# Patient Record
Sex: Male | Born: 1970 | Race: White | Hispanic: No | Marital: Married | Smoking: Never smoker
Health system: Southern US, Community
[De-identification: ages and names within clinical notes are randomized; demographics above are authoritative.]

## PROBLEM LIST (undated history)

## (undated) DIAGNOSIS — C189 Malignant neoplasm of colon, unspecified: Secondary | ICD-10-CM

---

## 2000-01-29 ENCOUNTER — Emergency Department (HOSPITAL_COMMUNITY): Admission: EM | Admit: 2000-01-29 | Discharge: 2000-01-29 | Payer: Self-pay | Admitting: Emergency Medicine

## 2000-01-29 ENCOUNTER — Encounter: Payer: Self-pay | Admitting: Emergency Medicine

## 2002-11-17 ENCOUNTER — Emergency Department (HOSPITAL_COMMUNITY): Admission: EM | Admit: 2002-11-17 | Discharge: 2002-11-17 | Payer: Self-pay | Admitting: Emergency Medicine

## 2005-03-28 ENCOUNTER — Emergency Department (HOSPITAL_COMMUNITY): Admission: EM | Admit: 2005-03-28 | Discharge: 2005-03-28 | Payer: Self-pay | Admitting: Emergency Medicine

## 2006-12-28 ENCOUNTER — Emergency Department (HOSPITAL_COMMUNITY): Admission: EM | Admit: 2006-12-28 | Discharge: 2006-12-28 | Payer: Self-pay | Admitting: Emergency Medicine

## 2007-02-28 ENCOUNTER — Emergency Department (HOSPITAL_COMMUNITY): Admission: EM | Admit: 2007-02-28 | Discharge: 2007-02-28 | Payer: Self-pay | Admitting: Emergency Medicine

## 2011-03-28 LAB — URINALYSIS, ROUTINE W REFLEX MICROSCOPIC
Bilirubin Urine: NEGATIVE
Glucose, UA: NEGATIVE
Hgb urine dipstick: NEGATIVE
Ketones, ur: NEGATIVE
Nitrite: NEGATIVE
Protein, ur: NEGATIVE
Specific Gravity, Urine: 1.008
Urobilinogen, UA: 0.2
pH: 6

## 2015-11-07 DIAGNOSIS — E781 Pure hyperglyceridemia: Secondary | ICD-10-CM | POA: Insufficient documentation

## 2015-11-07 DIAGNOSIS — E6609 Other obesity due to excess calories: Secondary | ICD-10-CM | POA: Insufficient documentation

## 2015-11-07 DIAGNOSIS — F325 Major depressive disorder, single episode, in full remission: Secondary | ICD-10-CM | POA: Insufficient documentation

## 2016-11-14 DIAGNOSIS — Z789 Other specified health status: Secondary | ICD-10-CM | POA: Insufficient documentation

## 2019-08-01 DIAGNOSIS — C787 Secondary malignant neoplasm of liver and intrahepatic bile duct: Secondary | ICD-10-CM | POA: Insufficient documentation

## 2021-02-19 ENCOUNTER — Encounter: Payer: Self-pay | Admitting: *Deleted

## 2021-02-20 ENCOUNTER — Ambulatory Visit
Admission: RE | Admit: 2021-02-20 | Discharge: 2021-02-20 | Disposition: A | Discharge status: Home / Self Care | Payer: Self-pay | Source: Ambulatory Visit | Attending: Oncology | Admitting: Oncology

## 2021-02-20 ENCOUNTER — Telehealth: Payer: Self-pay | Admitting: Medical Oncology

## 2021-02-20 ENCOUNTER — Other Ambulatory Visit: Payer: Self-pay | Admitting: *Deleted

## 2021-02-20 ENCOUNTER — Encounter: Payer: Self-pay | Admitting: *Deleted

## 2021-02-20 DIAGNOSIS — Z9189 Other specified personal risk factors, not elsewhere classified: Secondary | ICD-10-CM

## 2021-02-20 NOTE — Telephone Encounter (Signed)
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 Benay Spice tomorrow and is interested in the [facility] study. Call to patient to inquire about emailing him the study consent form for information purposes so that he can review and discuss with spouse and if he has any questions, can discuss with Dr. Benay Spice at his appointment. Patient stated he would like that and provided me with his email address. I also inquired with patient if he would be available prior to MD appointment or after MD appointment to review the consent with a research nurse, should he be interested, and sign ICF. Patient stated he would be available to come in early or stay after. Patient was informed that someone from research will meet with him to review consent tomorrow. Patient denied further questions at this time. I thanked patient for his time and interest in study and encouraged him to contact Dr. Benay Spice. Patient was informed that I will be out of the office through the end of the week and will return on Monday the 12th, patient provided with Doreatha Martin, System-wide manager, contact information should he have any research related questions.  Patient was emailed study consent form Global V2 [id number], [place] Main ICF V2.0 [phone] for his review.  [name], RN, BSN, [facility] Clinical Research 02/20/2021 2:29 PM

## 2021-02-21 ENCOUNTER — Inpatient Hospital Stay: Payer: Managed Care, Other (non HMO) | Attending: Oncology | Admitting: Oncology

## 2021-02-21 ENCOUNTER — Other Ambulatory Visit: Payer: Self-pay | Admitting: *Deleted

## 2021-02-21 ENCOUNTER — Inpatient Hospital Stay: Payer: Managed Care, Other (non HMO) | Admitting: Emergency Medicine

## 2021-02-21 ENCOUNTER — Other Ambulatory Visit: Payer: Self-pay

## 2021-02-21 ENCOUNTER — Telehealth: Payer: Self-pay | Admitting: Emergency Medicine

## 2021-02-21 VITALS — BP 136/90 | HR 84 | Temp 97.8°F | Resp 20 | Ht 70.0 in | Wt 226.6 lb

## 2021-02-21 DIAGNOSIS — Z9189 Other specified personal risk factors, not elsewhere classified: Secondary | ICD-10-CM

## 2021-02-21 DIAGNOSIS — C187 Malignant neoplasm of sigmoid colon: Secondary | ICD-10-CM | POA: Diagnosis not present

## 2021-02-21 DIAGNOSIS — Z006 Encounter for examination for normal comparison and control in clinical research program: Secondary | ICD-10-CM | POA: Insufficient documentation

## 2021-02-21 DIAGNOSIS — C787 Secondary malignant neoplasm of liver and intrahepatic bile duct: Secondary | ICD-10-CM | POA: Insufficient documentation

## 2021-02-21 DIAGNOSIS — E119 Type 2 diabetes mellitus without complications: Secondary | ICD-10-CM | POA: Diagnosis not present

## 2021-02-21 DIAGNOSIS — Z86711 Personal history of pulmonary embolism: Secondary | ICD-10-CM | POA: Diagnosis not present

## 2021-02-21 NOTE — Progress Notes (Unsigned)
ou

## 2021-02-21 NOTE — Telephone Encounter (Signed)
[place] / "A Phase 3 Multicenter, Randomized, Openlabel, Active-controlled Study of Sotorasib and Panitumumab Versus Investigator's Choice (Trifluridine and Tipiracil, or Regorafenib) for the Treatment of Previously Treated Metastatic Colorectal Cancer Subjects with KRAS p.G12C Mutation."  Called pt to let him know I will speak to him regarding study after his appt with MD Benay Spice today at 1:40pm and to call back before then with any questions/concerns.  No answer, left VM with this info.  Provided name and call back number.  Wells Guiles 'Learta CoddingNeysa Bonito, RN, BSN Clinical Research Nurse I 02/21/21 10:58 AM

## 2021-02-21 NOTE — Research (Addendum)
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  This Clinical Research Nurse met with Dan Bell, [id number], on 02/21/21 in a manner and location that ensures patient privacy to discuss participation in the above listed research study.  Patient is Accompanied by spouse today .  A copy of the informed consent document with embedded HIPAA language was provided to the patient.  Patient reads, speaks, and understands [place].    Patient was provided with the business card of this Nurse and encouraged to contact the research team with any questions.  Patient was provided the option of taking informed consent documents home to review and was encouraged to review at their convenience with their support network, including other care providers. Patient is comfortable with making a decision regarding study participation today.  As outlined in the informed consent form, this Nurse and Ballard Russell discussed the purpose of the research study, the investigational nature of the study, study procedures and requirements for study participation, potential risks and benefits of study participation, as well as alternatives to participation. This study is not blinded. The patient understands participation is voluntary and they may withdraw from study participation at any time.  Each study arm was reviewed, and randomization discussed.  Potential side effects were reviewed with patient as outlined in the consent form, and patient made aware there may be side effects not yet known. This study does not involve  a placebo. Patient understands enrollment is pending full eligibility review.   Confidentiality and how the patient's information will be used as part of study participation were discussed.  Patient was informed there is reimbursement provided for their time and effort spent on trial participation.  The patient is encouraged to discuss research study participation with their insurance provider to determine what costs they may incur as part of study participation, including research related injury.    All questions were answered to patient's satisfaction.  The informed consent with embedded HIPAA language was reviewed page by page.  The patient's mental and emotional status is appropriate to provide informed consent, and the patient verbalizes an understanding of study participation.  Patient has agreed to participate in the above listed research study and has voluntarily signed the informed consent version 2.0 ([place] Main ICF) with embedded HIPAA language, version 2.0 ([place] Main ICF)  on 02/21/21 at 3:14 PM.  The patient was provided with a copy of the signed informed consent form with embedded HIPAA language for their reference.  No study specific procedures were obtained prior to the signing of the informed consent document.  Approximately 60 minutes were spent with the patient reviewing the informed consent documents.  Patient was not requested to complete a Release of Information form.  Will f/u with patient in next few days for scheduling of needed appts and assessments, pt aware to expect call.  Wells Guiles '[place], RN, BSN Clinical Research Nurse I 02/21/21 3:31 PM   LATE ENTRY/ADDENDUM  Second page of study consent requested 'Participant ID' which was left blank as patient has not been enrolled yet and hasn't been assigned a study ID.  Patient reports having a vasectomy and his wife having an IUD, both verbalizing  understanding regarding study restrictions on pregnancy.  Patient was seen  today per referral from [facility] (Dr. Harlow Asa) by MD Benay Spice as part of standard of care, not as part of study procedures.  Patient signed consent form after visit with MD Benay Spice.  Wells Guiles 'Learta CoddingNeysa Bonito, RN, BSN Clinical Research Nurse I 02/21/21 4:16 PM

## 2021-02-21 NOTE — Progress Notes (Addendum)
[place] New Patient Consult   Requesting MD: Christian Mate., Md [address] [place],  [place] [address]   Dan Bell 50 y.o.  Apr 29, 1971    Reason for Consult: Colon cancer   HPI: Mr. Going developed intermittent nausea/vomiting and weight loss in the summer 2020.  He reports losing 40 pounds. He was referred to Dr. Rolm Bookbinder and was taken to an GD and colonoscopy on 07/27/2019.  There was evidence of Barrett's esophagus on the EGD.  The colonoscopy revealed an infiltrative sick centimeter mass in the distal sigmoid colon.  The mass caused partial obstruction.  The pathology from the sigmoid colon biopsy revealed well differentiated adenocarcinoma. He was referred for staging CTs on 07/28/2019.  The CTs revealed numerous liver metastases, a distal sigmoid mass, a necrotic lymph node in the sigmoid mesentery, and enlarged porta hepatis nodes.  The CEA was markedly elevated.  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  The tumor was positive for a K-ras G 12 C mutation.  MMR proficient.  He was treated with FOLFOX plus bevacizumab for a total of 10 cycles.  He had an infusion reaction to oxaliplatin with cycle 10.  He completed cycle 11 on 01/25/2020 without oxaliplatin.  He completed a total of 19 cycles and treatment was discontinued secondary to disease progression.  Treatment was changed to FOLFIRI plus bevacizumab beginning 06/20/2020.  He has completed 12 cycles. Restaging CTs on 02/11/2021 revealed more prominent juxta diaphragmatic lymph nodes and distal left paraesophageal nodes.  Right lower lobe and left lower lobe pulmonary nodules.,  Liver metastases are slightly increased in size.  There is evidence of portal hypertension with distal paraesophageal varices.  Splenomegaly.  He discussed treatment options with Dr. Laverle Hobby including standard treatment with Lonsurf or regorafenib.  He is referred to  consider enrollment on the [employer] sotorasib study.  Mr. Jurewicz feels well at present.  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  His maternal grandmother had melanoma.  His father had lung cancer and was a smoker  Social History:   He lives with his wife and daughter in [place].  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  He uses a vape apparatus, rare alcohol use.  No transfusion history.  No risk factor for HIV or hepatitis.  He has received COVID-19 vaccines  ROS:   Positives include: Weight loss prior to the diagnosis of colon cancer-resolved, nausea 1 to 2 days following chemotherapy, decreased visual acuity-wears readers  A complete ROS was otherwise negative.  Physical Exam:  Blood pressure 136/90, pulse 84, temperature 97.8 F (36.6 C), temperature source Oral, resp. rate 20, height '5\' 10"'  (1.778 m), weight 226 lb 9.6 oz (102.8 kg), SpO2 100 %.  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, mismatch repair proficient Colonoscopy 07/27/2019-sigmoid colon mass, biopsy-well differentiated adenocarcinoma CTs chest, abdomen, pelvis  07/28/2019-numerous liver metastases, mass in the distal sigmoid colon, necrotic appearing node in the sigmoid mesentery, enlarged porta hepatis and portacaval nodes Markedly elevated CEA 08/01/2019 Biopsy of liver lesion 08/23/2019-metastatic mucinous adenocarcinoma, CK20 and CDX2 positive FOLFOX plus bevacizumab, cycle 1 08/24/2019, 11 total cycles, oxaliplatin held with cycle 11 secondary to a reaction with cycle 10 FOLFOX plus bevacizumab discontinued after cycle 19 secondary to disease progression FOLFIRI plus bevacizumab beginning 06/20/2020, status post 12 cycles Disease progression on CT 02/11/2021-enlarging lung nodules and liver lesions CEA increased 02/13/2021 Type 2 diabetes Left lower extremity DVT 2018 Oxaliplatin reaction with cycle 10    Disposition:   Mr. Kiernan has a history of metastatic colon cancer diagnosed in February 2021.  He has been treated with FOLFOX/bevacizumab and FOLFIRI/bevacizumab.  Restaging CTs last week confirmed disease progression.  The CEA was more elevated on 02/13/2021.  Dr. Harlow Asa recommends discontinuing FOLFIRI/bevacizumab.  He discussed treatment options with Mr. Medford including standard salvage treatment with regorafenib or Lonsurf.  He also discussed the [employer] sotorasib clinical trial.  Mr. Wilmes was referred to consider enrollment on the clinical trial.  I discussed the treatment schema with Mr. Navarez.  We discussed the randomization to standard salvage therapy versus sotorasib/panitumumab.  I recommend Lonsurf as standard salvage therapy.  We reviewed potential toxicities associated with Lonsurf including the chance of hematologic toxicity, nausea, and diarrhea.  We discussed the allergic reaction, rash, paronychia, skin dryness, and diarrhea associated with panitumumab.  We reviewed toxicities associated with sotorasib including the chance of a rash, diarrhea, hepatic toxicity, and hematologic toxicity.  He met with the research nurse today  for further discussion.  Mr. Locy would like to proceed with enrollment on the [employer] code brake 300 study.  We will begin the enrollment process.  A follow-up appoint will be scheduled after he has completed the for laboratory, CT, and pathology review.  His ECOG performance status is 0.  Betsy Coder, MD  02/21/2021, 2:06 PM

## 2021-02-26 ENCOUNTER — Telehealth: Payer: Self-pay | Admitting: Medical Oncology

## 2021-02-26 NOTE — Telephone Encounter (Signed)
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 I returned call to patient and informed him that I continue with eligibility check and will need to schedule him for screening labs, ECG and completion of questionnaires at the [facility]. Patient gave his verbal understanding and stated his schedule is open and he is free anytime. I informed patient that I will check what appointment times are available and will return call to him with scheduled appointment time. Patient denied further questions at this time. Patient thanked and was encouraged to call in the meantime.   [name], RN, BSN, [facility] Clinical Research 02/26/2021 1:48 PM

## 2021-02-26 NOTE — Telephone Encounter (Signed)
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  Patient was provided with rationale of giving his platelets some time to return to within normal level. Patient gave verbal understanding. Study screening appointment set for Monday, (03/04/21) at 10:00 AM at the [facility]. Patient was provided with directions to [facility] site. All patient's questions answered to his satisfaction, patient thanked for his time and encouraged to call in the meantime.  [name], RN, BSN, [facility] Clinical Research 02/26/2021 4:50 PM

## 2021-02-27 ENCOUNTER — Other Ambulatory Visit: Payer: Self-pay | Admitting: Medical Oncology

## 2021-02-27 DIAGNOSIS — C187 Malignant neoplasm of sigmoid colon: Secondary | ICD-10-CM

## 2021-03-04 ENCOUNTER — Encounter: Payer: Self-pay | Admitting: Medical Oncology

## 2021-03-04 ENCOUNTER — Inpatient Hospital Stay: Payer: Managed Care, Other (non HMO)

## 2021-03-04 ENCOUNTER — Other Ambulatory Visit: Payer: Self-pay

## 2021-03-04 DIAGNOSIS — C187 Malignant neoplasm of sigmoid colon: Secondary | ICD-10-CM

## 2021-03-04 DIAGNOSIS — C787 Secondary malignant neoplasm of liver and intrahepatic bile duct: Secondary | ICD-10-CM | POA: Diagnosis not present

## 2021-03-04 DIAGNOSIS — Z006 Encounter for examination for normal comparison and control in clinical research program: Secondary | ICD-10-CM | POA: Diagnosis present

## 2021-03-04 LAB — CBC WITH DIFFERENTIAL (CANCER CENTER ONLY)
Abs Immature Granulocytes: 0.02 10*3/uL (ref 0.00–0.07)
Basophils Absolute: 0.1 10*3/uL (ref 0.0–0.1)
Basophils Relative: 1 %
Eosinophils Absolute: 0.5 10*3/uL (ref 0.0–0.5)
Eosinophils Relative: 9 %
HCT: 42.8 % (ref 39.0–52.0)
Hemoglobin: 14.6 g/dL (ref 13.0–17.0)
Immature Granulocytes: 0 %
Lymphocytes Relative: 21 %
Lymphs Abs: 1.2 10*3/uL (ref 0.7–4.0)
MCH: 29.1 pg (ref 26.0–34.0)
MCHC: 34.1 g/dL (ref 30.0–36.0)
MCV: 85.3 fL (ref 80.0–100.0)
Monocytes Absolute: 0.5 10*3/uL (ref 0.1–1.0)
Monocytes Relative: 9 %
Neutro Abs: 3.3 10*3/uL (ref 1.7–7.7)
Neutrophils Relative %: 60 %
Platelet Count: 116 10*3/uL — ABNORMAL LOW (ref 150–400)
RBC: 5.02 MIL/uL (ref 4.22–5.81)
RDW: 15 % (ref 11.5–15.5)
WBC Count: 5.7 10*3/uL (ref 4.0–10.5)
nRBC: 0 % (ref 0.0–0.2)

## 2021-03-04 LAB — CMP (CANCER CENTER ONLY)
ALT: 33 U/L (ref 0–44)
AST: 25 U/L (ref 15–41)
Albumin: 3.5 g/dL (ref 3.5–5.0)
Alkaline Phosphatase: 203 U/L — ABNORMAL HIGH (ref 38–126)
Anion gap: 10 (ref 5–15)
BUN: 16 mg/dL (ref 6–20)
CO2: 24 mmol/L (ref 22–32)
Calcium: 10.1 mg/dL (ref 8.9–10.3)
Chloride: 103 mmol/L (ref 98–111)
Creatinine: 1.37 mg/dL — ABNORMAL HIGH (ref 0.61–1.24)
GFR, Estimated: >60 mL/min (ref >60)
Glucose, Bld: 199 mg/dL — ABNORMAL HIGH (ref 70–99)
Potassium: 4.5 mmol/L (ref 3.5–5.1)
Sodium: 137 mmol/L (ref 135–145)
Total Bilirubin: 1 mg/dL (ref 0.3–1.2)
Total Protein: 7 g/dL (ref 6.5–8.1)

## 2021-03-04 LAB — HEPATITIS C ANTIBODY: HCV Ab: NONREACTIVE

## 2021-03-04 LAB — URINALYSIS, COMPLETE (UACMP) WITH MICROSCOPIC
Bilirubin Urine: NEGATIVE
Glucose, UA: 50 mg/dL — AB
Hgb urine dipstick: NEGATIVE
Ketones, ur: NEGATIVE mg/dL
Leukocytes,Ua: NEGATIVE
Nitrite: NEGATIVE
Protein, ur: 100 mg/dL — AB
Specific Gravity, Urine: 1.03 (ref 1.005–1.030)
pH: 6 (ref 5.0–8.0)

## 2021-03-04 LAB — CEA (IN HOUSE-CHCC): CEA (CHCC-In House): 515.95 ng/mL — ABNORMAL HIGH (ref 0.00–5.00)

## 2021-03-04 LAB — T4, FREE: Free T4: 1.06 ng/dL (ref 0.61–1.12)

## 2021-03-04 LAB — BILIRUBIN, DIRECT: Bilirubin, Direct: 0.3 mg/dL — ABNORMAL HIGH (ref 0.0–0.2)

## 2021-03-04 LAB — MAGNESIUM: Magnesium: 1.5 mg/dL — ABNORMAL LOW (ref 1.7–2.4)

## 2021-03-04 LAB — PROTIME-INR
INR: 1 (ref 0.8–1.2)
Prothrombin Time: 12.8 seconds (ref 11.4–15.2)

## 2021-03-04 LAB — PHOSPHORUS: Phosphorus: 3.7 mg/dL (ref 2.5–4.6)

## 2021-03-04 LAB — HEPATITIS B CORE ANTIBODY, TOTAL: Hep B Core Total Ab: NONREACTIVE

## 2021-03-04 LAB — HEPATITIS B SURFACE ANTIGEN: Hepatitis B Surface Ag: NONREACTIVE

## 2021-03-04 LAB — CK: Total CK: 45 U/L — ABNORMAL LOW (ref 49–397)

## 2021-03-04 NOTE — Progress Notes (Signed)
TRIAL: [place] / "A Phase 3 Multicenter, Randomized, Openlabel, Active-controlled Study of Sotorasib and Panitumumab Versus Investigator's Choice (Trifluridine and Tipiracil, or Regorafenib) for the Treatment of Previously Treated Metastatic Colorectal Cancer Subjects with KRAS p.G12C Mutation."   Met with patient this morning for study screening assessments.  Patient presented to clinic alone for ECG, labs and questionnaires, all part of the screening assessments, for the [employer] study. Patient reports to be doing well.  I confirmed with patient that he has no difficulty with swallowing   ECG: was completed after patient had had a five minute rest in supine position.  Screening Labs: study required screening labs, including a UA collected after the ECG. Results are pending.  Screening Questionnaires: Patient completed study required screening questionnaires using the study provided iPAD.  Adverse Events: I reviewed with patient any adverse events he may have present that continues from chemotherapy or in general. Patient denies having any continuing side effects from chemo or in general. Patient denies having any radiation treatment.  Concomitant Medications: Patient's medication list was reviewed. Patient confirms that he only takes diabetic medication. Patient reports to not take lisinopril, or Mobic denies taking or being on any blood thinners.   Plan: I informed patient that once all labs result and if within study inclusion/exclusion parameters, we will proceed to scheduling the study required screening CT. All patient's questions answered to his satisfaction. Patient was thanked for his time and informed that I will call him with lab results and further planning of the next steps. Patient was encouraged to call me or Dr. Benay Spice with questions/concerns. Patient confirms to have my contact information.  Patient also completed a Release of Information (ROI), for research purposes, during this  visit.  [name], RN, BSN, [facility] Clinical Research 03/04/2021 1:26 PM

## 2021-03-05 LAB — THYROID PANEL WITH TSH
Free Thyroxine Index: 2.7 (ref 1.2–4.9)
T3 Uptake Ratio: 31 % (ref 24–39)
T4, Total: 8.8 ug/dL (ref 4.5–12.0)
TSH: 0.923 u[IU]/mL (ref 0.450–4.500)

## 2021-03-05 LAB — T3: T3, Total: 106 ng/dL (ref 71–180)

## 2021-03-06 ENCOUNTER — Other Ambulatory Visit: Payer: Self-pay | Admitting: Medical Oncology

## 2021-03-06 ENCOUNTER — Encounter: Payer: Self-pay | Admitting: Medical Oncology

## 2021-03-06 DIAGNOSIS — C187 Malignant neoplasm of sigmoid colon: Secondary | ICD-10-CM

## 2021-03-06 LAB — PTT FACTOR INHIBITOR (MIXING STUDY): aPTT: 24.7 s (ref 22.9–30.2)

## 2021-03-06 NOTE — Progress Notes (Signed)
TRIAL: [place] / "A Phase 3 Multicenter, Randomized, Openlabel, Active-controlled Study of Sotorasib and Panitumumab Versus Investigator's Choice (Trifluridine and Tipiracil, or Regorafenib) for the Treatment of Previously Treated Metastatic Colorectal Cancer Subjects with KRAS p.G12C Mutation."  Outgoing call: 12:49 PM Informed patient of his lab results and that they meet eligibility requirements and I will proceed to schedule him for a CT scan, with contrast of the chest, abdomen and pelvis. Patient informed that this scan will not be charged to his insurance. Informed patient that I will call him back with date and time CT is scheduled. Patient confirms his time is flexible. Patient gave verbal understanding and denies any questions.   Outgoing call: 1:30 PM Spoke with patient and informed him of CT appointment scheduled for Tuesday 9/27 @ 3:30. Patient instructed to arrive at 3:15. Patient also informed that he will need to pick up contrast for CT. Patient was provided with instructions to be nothing by mouth four hours prior to his appointment, to drink 1st bottle of contrast 2 hrs before, 2nd bottle of contrast 1 hr before CT appointment. Patient confirms understanding of instructions and states will pickup contrast Friday 9/23 after lunch. Patient was provided with directions for contrast pickup as well as where CT will be completed. Patient verbally confirms understanding of instructions and denies questions at this time. Patient thanked for his time and was encouraged to call with any questions or concerns in the meantime.  [name], RN, BSN, [facility] Clinical Research 03/06/2021 3:00 PM

## 2021-03-11 ENCOUNTER — Ambulatory Visit (HOSPITAL_COMMUNITY)
Admission: RE | Admit: 2021-03-11 | Discharge: 2021-03-11 | Disposition: A | Discharge status: Home / Self Care | Payer: Self-pay | Source: Ambulatory Visit | Attending: Oncology | Admitting: Oncology

## 2021-03-11 ENCOUNTER — Other Ambulatory Visit: Payer: Self-pay

## 2021-03-11 DIAGNOSIS — C187 Malignant neoplasm of sigmoid colon: Secondary | ICD-10-CM

## 2021-03-11 MED ORDER — IOHEXOL 350 MG/ML SOLN
80.0000 mL | Freq: Once | INTRAVENOUS | Status: AC | PRN
  Administered 2021-03-11: 80 mL via INTRAVENOUS

## 2021-03-12 ENCOUNTER — Telehealth: Payer: Self-pay | Admitting: Medical Oncology

## 2021-03-12 ENCOUNTER — Ambulatory Visit (HOSPITAL_COMMUNITY): Payer: Self-pay

## 2021-03-12 ENCOUNTER — Encounter (HOSPITAL_COMMUNITY): Payer: Self-pay

## 2021-03-12 NOTE — Telephone Encounter (Signed)
TRIAL: [place] / "A Phase 3 Multicenter, Randomized, Openlabel, Active-controlled Study of Sotorasib and Panitumumab Versus Investigator's Choice (Trifluridine and Tipiracil, or Regorafenib) for the Treatment of Previously Treated Metastatic Colorectal Cancer Subjects with KRAS p.G12C Mutation."   Outgoing call: 4:03 Call to patient to update him on tumor tissue sent to study for KRAS pG12c mutation testing. Patient was informed that we were notified by study that there wasn't enough tissue for study to be able to complete the mutation testing. Patient was also informed that we have requested another sample from [facility] and that this sample was sent to study today for testing. I discussed with patient, if this sample was also found to be not adequate for testing, what other options we may have. Patient was informed that he has the option to have another (fresh) biopsy and I would confirm whether the study pays for it and will call him back. All patient questions answered to his satisfaction. Patient denies further questions at this time. Patient thanked for his time and was encouraged to call Dr. Benay Spice or myself with questions/concerns.  [name], RN, BSN, [facility] Clinical Research 03/12/2021 4:34 PM

## 2021-03-20 ENCOUNTER — Telehealth: Payer: Self-pay | Admitting: Medical Oncology

## 2021-03-20 NOTE — Telephone Encounter (Signed)
TRIAL: [place] / "A Phase 3 Multicenter, Randomized, Openlabel, Active-controlled Study of Sotorasib and Panitumumab Versus Investigator's Choice (Trifluridine and Tipiracil, or Regorafenib) for the Treatment of Previously Treated Metastatic Colorectal Cancer Subjects with KRAS p.G12C Mutation."  Outgoing call: 4:44 Patient informed me earlier today that he received a letter from [facility] informing him that insurance denied 9/27 CT scan coverage.  Call back to patient this afternoon informing him that we spoke with billing and confirmed that his insurance was not billed and that [place] study was billed as required and that he should not be receiving a bill regarding this scan. Patient gave verbal thanks. Denies further questions at this time.  Patient thanked and encouraged to call with questions.   [name], RN, BSN, [facility] Clinical Research Nurse Lead 03/20/2021 4:47 PM

## 2021-03-21 ENCOUNTER — Telehealth: Payer: Self-pay | Admitting: Medical Oncology

## 2021-03-21 DIAGNOSIS — C187 Malignant neoplasm of sigmoid colon: Secondary | ICD-10-CM

## 2021-03-21 NOTE — Telephone Encounter (Signed)
TRIAL: [place] / "A Phase 3 Multicenter, Randomized, Openlabel, Active-controlled Study of Sotorasib and Panitumumab Versus Investigator's Choice (Trifluridine and Tipiracil, or Regorafenib) for the Treatment of Previously Treated Metastatic Colorectal Cancer Subjects with KRAS p.G12C Mutation."  Outgoing call: Spoke with patient regarding most recent tissue block sent to study was confirmed for the KRAS p.G12C mutation. I informed patient that the 28 day screening window has expired. I was informed by the study that patient will need to be re consented and have all screening procedures completed again, except for CT scan and tissue block testing. Patient gave his verbal understanding. Patient scheduled to be seen by Dr. Benay Spice tomorrow morning at [place]. Patient was asked to arrive at 0800 to be reconsented, prior to study screening assessments. Patient confirms that he is able to arrive early. Patient will need labs and ECG as we as completion of screening questionnaires. Patient states that he has a work meeting sometime around 11 AM, but meeting may be cancelled and he can complete the other assessments. I asked patient to keep [place] informed and that we can also collect the assessments in the afternoon, after his meeting. I inquired with patient if he was available this afternoon to reconsent and he stated he was not.  Patient informed that another research nurse will meet with him tomorrow to complete screening assessments.  Patient thanked for his time and continued support of study and was encouraged to call with questions/concerns.   [name], RN, BSN, [facility] Clinical Research Nurse Lead 03/21/2021 2:15 PM

## 2021-03-22 ENCOUNTER — Ambulatory Visit: Payer: Managed Care, Other (non HMO)

## 2021-03-22 ENCOUNTER — Encounter: Payer: Self-pay | Admitting: *Deleted

## 2021-03-22 ENCOUNTER — Other Ambulatory Visit: Payer: Self-pay

## 2021-03-22 ENCOUNTER — Inpatient Hospital Stay: Payer: Managed Care, Other (non HMO) | Attending: Oncology | Admitting: Oncology

## 2021-03-22 ENCOUNTER — Inpatient Hospital Stay: Payer: Managed Care, Other (non HMO)

## 2021-03-22 ENCOUNTER — Ambulatory Visit: Payer: Managed Care, Other (non HMO) | Admitting: Oncology

## 2021-03-22 VITALS — BP 139/93 | HR 94 | Temp 98.7°F | Resp 18 | Ht 70.0 in | Wt 228.2 lb

## 2021-03-22 DIAGNOSIS — C187 Malignant neoplasm of sigmoid colon: Secondary | ICD-10-CM | POA: Diagnosis present

## 2021-03-22 DIAGNOSIS — R970 Elevated carcinoembryonic antigen [CEA]: Secondary | ICD-10-CM | POA: Diagnosis not present

## 2021-03-22 DIAGNOSIS — R21 Rash and other nonspecific skin eruption: Secondary | ICD-10-CM | POA: Diagnosis not present

## 2021-03-22 DIAGNOSIS — Z86718 Personal history of other venous thrombosis and embolism: Secondary | ICD-10-CM | POA: Diagnosis not present

## 2021-03-22 DIAGNOSIS — R918 Other nonspecific abnormal finding of lung field: Secondary | ICD-10-CM | POA: Insufficient documentation

## 2021-03-22 DIAGNOSIS — E119 Type 2 diabetes mellitus without complications: Secondary | ICD-10-CM | POA: Insufficient documentation

## 2021-03-22 DIAGNOSIS — C787 Secondary malignant neoplasm of liver and intrahepatic bile duct: Secondary | ICD-10-CM | POA: Insufficient documentation

## 2021-03-22 DIAGNOSIS — D696 Thrombocytopenia, unspecified: Secondary | ICD-10-CM | POA: Insufficient documentation

## 2021-03-22 DIAGNOSIS — Z006 Encounter for examination for normal comparison and control in clinical research program: Secondary | ICD-10-CM | POA: Insufficient documentation

## 2021-03-22 LAB — HEPATITIS B SURFACE ANTIGEN: Hepatitis B Surface Ag: NONREACTIVE

## 2021-03-22 LAB — BILIRUBIN, DIRECT: Bilirubin, Direct: 0.3 mg/dL — ABNORMAL HIGH (ref 0.0–0.2)

## 2021-03-22 LAB — CMP (CANCER CENTER ONLY)
ALT: 61 U/L — ABNORMAL HIGH (ref 0–44)
AST: 41 U/L (ref 15–41)
Albumin: 3.5 g/dL (ref 3.5–5.0)
Alkaline Phosphatase: 376 U/L — ABNORMAL HIGH (ref 38–126)
Anion gap: 10 (ref 5–15)
BUN: 19 mg/dL (ref 6–20)
CO2: 23 mmol/L (ref 22–32)
Calcium: 9.8 mg/dL (ref 8.9–10.3)
Chloride: 101 mmol/L (ref 98–111)
Creatinine: 1.25 mg/dL — ABNORMAL HIGH (ref 0.61–1.24)
GFR, Estimated: >60 mL/min (ref >60)
Glucose, Bld: 200 mg/dL — ABNORMAL HIGH (ref 70–99)
Potassium: 4.6 mmol/L (ref 3.5–5.1)
Sodium: 134 mmol/L — ABNORMAL LOW (ref 135–145)
Total Bilirubin: 1.2 mg/dL (ref 0.3–1.2)
Total Protein: 7.4 g/dL (ref 6.5–8.1)

## 2021-03-22 LAB — CBC WITH DIFFERENTIAL (CANCER CENTER ONLY)
Abs Immature Granulocytes: 0.03 10*3/uL (ref 0.00–0.07)
Basophils Absolute: 0.1 10*3/uL (ref 0.0–0.1)
Basophils Relative: 1 %
Eosinophils Absolute: 0.7 10*3/uL — ABNORMAL HIGH (ref 0.0–0.5)
Eosinophils Relative: 9 %
HCT: 46.2 % (ref 39.0–52.0)
Hemoglobin: 15.3 g/dL (ref 13.0–17.0)
Immature Granulocytes: 0 %
Lymphocytes Relative: 21 %
Lymphs Abs: 1.6 10*3/uL (ref 0.7–4.0)
MCH: 27.8 pg (ref 26.0–34.0)
MCHC: 33.1 g/dL (ref 30.0–36.0)
MCV: 83.8 fL (ref 80.0–100.0)
Monocytes Absolute: 0.7 10*3/uL (ref 0.1–1.0)
Monocytes Relative: 9 %
Neutro Abs: 4.5 10*3/uL (ref 1.7–7.7)
Neutrophils Relative %: 60 %
Platelet Count: 143 10*3/uL — ABNORMAL LOW (ref 150–400)
RBC: 5.51 MIL/uL (ref 4.22–5.81)
RDW: 14.8 % (ref 11.5–15.5)
WBC Count: 7.6 10*3/uL (ref 4.0–10.5)
nRBC: 0 % (ref 0.0–0.2)

## 2021-03-22 LAB — URINALYSIS, COMPLETE (UACMP) WITH MICROSCOPIC
Bacteria, UA: NONE SEEN
Bilirubin Urine: NEGATIVE
Glucose, UA: 150 mg/dL — AB
Hgb urine dipstick: NEGATIVE
Ketones, ur: NEGATIVE mg/dL
Leukocytes,Ua: NEGATIVE
Nitrite: NEGATIVE
Protein, ur: 100 mg/dL — AB
Specific Gravity, Urine: 1.025 (ref 1.005–1.030)
pH: 5 (ref 5.0–8.0)

## 2021-03-22 LAB — PROTIME-INR
INR: 1 (ref 0.8–1.2)
Prothrombin Time: 12.8 seconds (ref 11.4–15.2)

## 2021-03-22 LAB — T4, FREE: Free T4: 1.05 ng/dL (ref 0.61–1.12)

## 2021-03-22 LAB — TSH: TSH: 1.474 u[IU]/mL (ref 0.320–4.118)

## 2021-03-22 LAB — MAGNESIUM: Magnesium: 2.1 mg/dL (ref 1.7–2.4)

## 2021-03-22 LAB — CK: Total CK: 50 U/L (ref 49–397)

## 2021-03-22 LAB — CEA (IN HOUSE-CHCC): CEA (CHCC-In House): 646.08 ng/mL — ABNORMAL HIGH (ref 0.00–5.00)

## 2021-03-22 LAB — HEPATITIS B CORE ANTIBODY, TOTAL: Hep B Core Total Ab: NONREACTIVE

## 2021-03-22 LAB — PHOSPHORUS: Phosphorus: 3.7 mg/dL (ref 2.5–4.6)

## 2021-03-22 NOTE — Research (Signed)
Trial Name: "A Phase 3 Multicenter, Randomized, Openlabel, Active-controlled Study of Sotorasib and Panitumumab Versus Investigator's Choice (Trifluridine and Tipiracil, or Regorafenib) for the Treatment of Previously Treated Metastatic Colorectal Cancer Subjects with KRAS p.G12C Mutation."  Patient Dan Bell was previously identified by Dr. Benay Spice as a potential candidate for the above listed study.  Patient presents to clinic today to re-consent and complete screening activities on this trial. This Clinical Research Nurse met with Dan Bell, Dan Bell on 03/22/21 in a manner and location that ensures patient privacy to discuss participation in the above listed research study.  Patient is Unaccompanied.  Patient was previously provided with informed consent documents.  Patient confirmed they have read the informed consent documents.  As outlined in the informed consent form, this Nurse and Dan Bell discussed the purpose of the research study, the investigational nature of the study, study procedures and requirements for study participation, potential risks and benefits of study participation, as well as alternatives to participation.  This study is not blinded or double-blinded. The patient understands participation is voluntary and they may withdraw from study participation at any time.  Each study arm was reviewed, and randomization discussed.  Potential side effects were reviewed with patient as outlined in the consent form, and patient made aware there may be side effects not yet known. This study does not involve a placebo. Patient understands enrollment is pending full eligibility review.   Confidentiality and how the patient's information will be used as part of study participation were discussed.  Patient was informed there is reimbursement provided for their time and effort spent on trial participation.  The patient is encouraged to discuss research study participation  with their insurance provider to determine what costs they may incur as part of study participation, including research related injury.    All questions were answered to patient's satisfaction.  The informed consent with embedded HIPAA language was reviewed page by page.  The patient's mental and emotional status is appropriate to provide informed consent, and the patient verbalizes an understanding of study participation.  Patient has agreed to participate in the above listed research study and has voluntarily signed the informed consent version 2.0 with embedded HIPAA language, version 2.0  on 03/22/21 at 8:15 AM.  The patient did not want to wait for the copy of consent to be made today and states okay to give it to him when he comes in for his next visit.  He still has the copy from his first consent for his reference.  No study specific procedures were obtained prior to the signing of the informed consent document.  Approximately 15 minutes were spent with the patient reviewing the informed consent documents.    Screening Assessments: VS, Height and Weight:  Completed. See VS flowsheet. History and Physical: Completed by Dr. Benay Spice. See MD note/encounter from today.  ECG: Completed after patient had had a five minute rest in supine position. QTcF: 401.  Adverse Events: Patient denies any adverse events or new symptoms since he last saw Dr. Benay Spice. He is working 40 to 60 hours per week and states he feels well.   Concomitant Medications: Reviewed current medication list. Patient reports he stopped taking Lisinopril over 2 years ago and hasn't taken Meloxicam this year. Medication list updated.  Screening Labs: Patient traveled from the [facility] to the [facility] to have his study required screening labs collected. Screening Questionnaires: Patient completed study required screening questionnaires using the study  provided iPAD prior to his lab  appointment.   Plan: Informed patient that once all labs result are available and if within study inclusion/exclusion parameters, we will proceed to randomize patient to the study next week and he will be informed of his study arm assignment. Provider and infusion appointment will be scheduled by Dr. Benay Spice for next Friday. Patient verbalized understanding. All patient's questions answered to his satisfaction. Patient was thanked for his time and was encouraged to call research nurse or Dr. Benay Spice with questions/concerns in the meantime. Patient confirms contact information for research nurse and Dr. Gearldine Shown office.   Foye Spurling, BSN, RN, [facility] Nurse 03/22/2021

## 2021-03-22 NOTE — Progress Notes (Addendum)
  [facility] OFFICE PROGRESS NOTE   Diagnosis: Colon cancer  INTERVAL HISTORY:   Dan Bell returns as scheduled.  He reports feeling well he is working.  No complaint.  Objective:  Vital signs in last 24 hours:  Blood pressure (!) 139/93, pulse 94, temperature 98.7 F (37.1 C), temperature source Oral, resp. rate 18, height $RemoveBe'5\' 10"'rZdzwiXXv$  (1.778 m), weight 228 lb 3.2 oz (103.5 kg), SpO2 100 %.    Resp: Lungs clear bilaterally Cardio: Regular rate and rhythm GI: No hepatosplenomegaly Vascular: No leg edema Neuro: Alert and oriented  Portacath/PICC-without erythema  Lab Results:  Lab Results  Component Value Date   WBC 5.7 03/04/2021   HGB 14.6 03/04/2021   HCT 42.8 03/04/2021   MCV 85.3 03/04/2021   PLT 116 (L) 03/04/2021   NEUTROABS 3.3 03/04/2021    CMP  Lab Results  Component Value Date   NA 137 03/04/2021   K 4.5 03/04/2021   CL 103 03/04/2021   CO2 24 03/04/2021   GLUCOSE 199 (H) 03/04/2021   BUN 16 03/04/2021   CREATININE 1.37 (H) 03/04/2021   CALCIUM 10.1 03/04/2021   PROT 7.0 03/04/2021   ALBUMIN 3.5 03/04/2021   AST 25 03/04/2021   ALT 33 03/04/2021   ALKPHOS 203 (H) 03/04/2021   BILITOT 1.0 03/04/2021   GFRNONAA >60 03/04/2021    Lab Results  Component Value Date   CEA1 515.95 (H) 03/04/2021     Medications: I have reviewed the patient's current medications.   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 and CDX2 positive FOLFOX plus bevacizumab, cycle 1 08/24/2019, 11 total cycles, oxaliplatin held  with cycle 11 secondary to a reaction with cycle 10 FOLFOX plus bevacizumab discontinued after cycle 19 secondary to disease progression FOLFIRI plus bevacizumab beginning 06/20/2020, status post 12 cycles Disease progression on CT 02/11/2021-enlarging lung nodules and liver lesions CEA increased 02/13/2021 CTs 03/11/2021-bulky hypodense liver metastases, slightly progressive, enlargement of bilateral lung nodules, slight enlargement of mediastinal nodes, splenomegaly and gastroesophageal varices Type 2 diabetes Left lower extremity DVT 2018 Oxaliplatin reaction with cycle 10     Disposition: Dan Bell appears stable.  He has metastatic colon cancer with progressive disease following FOLFOX/bevacizumab and FOLFIRI/bevacizumab.  The tumor has a K-ras G 12 C mutation, confirmed by repeat testing via the [employer] study.  He agrees to enrollment on the [place] [facility] study.  He understands the study randomizes patients to standard of care (Lonsurf) versus sotorasib and panitumumab.  We reviewed potential toxicities associated with Lonsurf, sotorasib, and panitumumab.  He met with the research nurse today.  He agrees to proceed.  He will have repeat screening labs today.  The plan is to initiate treatment on study 03/29/2021.  His ECOG performance status is measured at 0 today.  He has a life expectancy of greater than 3 months.  Betsy Coder, MD  03/22/2021  8:36 AM

## 2021-03-23 LAB — T3: T3, Total: 134 ng/dL (ref 71–180)

## 2021-03-24 LAB — PTT FACTOR INHIBITOR (MIXING STUDY): aPTT: 24.9 s (ref 22.9–30.2)

## 2021-03-24 LAB — HEPATITIS C ANTIBODY: HCV Ab: <0.1 s/co ratio — AB (ref 0.0–0.9)

## 2021-03-27 ENCOUNTER — Telehealth: Payer: Self-pay | Admitting: Oncology

## 2021-03-27 NOTE — Telephone Encounter (Signed)
UPDATE on CHEMO TX  Note below from :[employer], RN  Hi Baxter Flattery and [place]. This patient is in the [facility] research study. Study drug is provided and will be delivered through [facility] from [facility]. No preauth needed. Let me know if you have any other questions.   Thank you!

## 2021-03-28 ENCOUNTER — Telehealth: Payer: Self-pay | Admitting: Medical Oncology

## 2021-03-28 NOTE — Telephone Encounter (Signed)
TRIAL: [place] / "A Phase 3 Multicenter, Randomized, Openlabel, Active-controlled Study of Sotorasib and Panitumumab Versus Investigator's Choice (Trifluridine and Tipiracil, or Regorafenib) for the Treatment of Previously Treated Metastatic Colorectal Cancer Subjects with KRAS p.G12C Mutation."  Outgoing call: 11:46 Call to patient to inform him that I have everything I need to enroll and randomize him to study. Informed patient that I cannot randomize him till tomorrow because study gives [place] three days to start treatment for cycle 1 and that falls on the weekend and should he be randomized to the study drug arm, there is a cycle 1 day 2 blood collection that would also fall on the weekend and we would not be able to collect these study required assessments. Patient informed that with randomization tomorrow, we will start him on treatment Monday 10/17 for cycle 1 day 1 and collect blood sample on cycle 1 day 2 on Tuesday. Patient expressed understanding and stated he would rather start on Monday. Patient informed that I will change his appointments to Monday and will notify him of new appointment times as well as of randomization results tomorrow. Patient denied having any questions at this time. Patient thanked for his time and was encouraged to call with questions in the meantime.  [name], RN, BSN, [place] Clinical Research Nurse Lead 03/28/2021 12:02 PM

## 2021-03-29 ENCOUNTER — Inpatient Hospital Stay: Payer: Managed Care, Other (non HMO) | Admitting: Nurse Practitioner

## 2021-03-29 ENCOUNTER — Inpatient Hospital Stay: Payer: Managed Care, Other (non HMO)

## 2021-03-29 ENCOUNTER — Encounter: Payer: Self-pay | Admitting: Medical Oncology

## 2021-03-29 DIAGNOSIS — C187 Malignant neoplasm of sigmoid colon: Secondary | ICD-10-CM

## 2021-03-29 NOTE — Research (Deleted)
TRIAL: [place] / "A Phase 3 Multicenter, Randomized, Openlabel, Active-controlled Study of Sotorasib and Panitumumab Versus Investigator's Choice (Trifluridine and Tipiracil, or Regorafenib) for the Treatment of Previously Treated Metastatic Colorectal Cancer Subjects with KRAS p.G12C Mutation."   This Coordinator has reviewed this patient's inclusion and exclusion criteria and confirmed Dan Bell meets eligibility for study participation.  Dr. Benay Spice has reviewed patient's labs and deemed them non clinically significant and confirms patient's eligibility for enrollment and randomization.  Patient will continue with enrollment.   The importance of avoiding pregnancy was discussed with Dan Bell and patient utilizes ***(enter type) of contraception measure.      Patient Dan Bell is randomized to Arm 2 Sotorasib 960 mg daily with panitumumab. Stratification criteria were confirmed by this clinical research Nurse and clinical research nurse Jeral Fruit  verified the stratification factors used for randomization.  As part of the assigned treatment arm, patient Dan Bell will receive *** treatment. MD will be notified of patient assignment; treatment will begin ***.  Per study protocol, patient Dan Bell {Will/Will [phone]} be notified of their treatment assignment. Patient Dan Bell is successfully enrolled in the above study.

## 2021-03-29 NOTE — Progress Notes (Addendum)
TRIAL: [place] / "A Phase 3 Multicenter, Randomized, Openlabel, Active-controlled Study of Sotorasib and Panitumumab Versus Investigator's Choice (Trifluridine and Tipiracil, or Regorafenib) for the Treatment of Previously Treated Metastatic Colorectal Cancer Subjects with KRAS p.G12C Mutation."   Inclusion/Exclusion This Coordinator has reviewed this patient's inclusion and exclusion criteria and confirmed Dan Bell meets eligibility for study participation.  Dr. Benay Spice has reviewed patient's inclusion/exclusion criteria, as well as screening labs, in which he deemed non clinically significant, and confirms patient's eligibility for enrollment and randomization. Patient to continue with enrollment and randomization.    It was also reconfirmed, with patient, the importance of avoiding pregnancy while on study and patient confirms understanding and the importance to use contraception, as was previously discussed with him during reviewing and signing of consent, and as stated in the consent form. Patient confirms no difficulty with taking oral medications and adhering to record daily investigational drug.    Randomization: 10:10 AM on 03/29/2021 Patient Dan Bell was randomized to Arm A. Stratification criteria were confirmed by this clinical research Nurse and clinical research nurse Jeral Fruit, verified the stratification factors used for randomization. As part of the assigned treatment arm, patient Dan Bell will receive Sotorasib 960 mg daily with panitumumab treatment. MD has been notified of patient assignment; treatment will begin Monday, 04/01/2021.  Per study protocol, patient Dan Bell will be notified of his treatment assignment. Patient Dan Bell has been successfully enrolled and randomized to the above study.  Lab orders have been placed for cycle 1 day 1 for 04/01/2021.  Outgoing call: 12:25 Patient informed of randomization result and that  he will receive study drug of Sotorasib 960 mg daily + panitumumab. I informed patient of appointment times for Monday morning and lab collection to be at [place] center site at [place], with MD appointment to follow at [facility] with infusion treatment. Patient was informed of what labs will be collected and that labs will be collected through a vein vs his port, due to several pre treatment research labs required to be drawn peripherally. Patient also informed that an ECG will be done, as well, when at MD appointment. Patient gave verbal understanding. All patient's questions answered to his satisfaction. Patient thanked for his time and encouraged to call with questions.   [name], RN, BSN, [place] Clinical Research Nurse Lead 03/29/2021 1:09 PM   This Nurse has reviewed this patient's inclusion and exclusion criteria on 03/25/2021 as a second review and confirms Dan Bell is eligible for study participation.  Patient may continue with enrollment. Jeral Fruit, RN 03/29/21 2:14 PM

## 2021-03-30 ENCOUNTER — Other Ambulatory Visit: Payer: Self-pay | Admitting: Oncology

## 2021-04-01 ENCOUNTER — Inpatient Hospital Stay: Payer: Managed Care, Other (non HMO)

## 2021-04-01 ENCOUNTER — Other Ambulatory Visit: Payer: Self-pay

## 2021-04-01 ENCOUNTER — Other Ambulatory Visit: Payer: Self-pay | Admitting: Medical Oncology

## 2021-04-01 ENCOUNTER — Encounter: Payer: Self-pay | Admitting: Medical Oncology

## 2021-04-01 ENCOUNTER — Inpatient Hospital Stay (HOSPITAL_BASED_OUTPATIENT_CLINIC_OR_DEPARTMENT_OTHER): Payer: Managed Care, Other (non HMO) | Admitting: Oncology

## 2021-04-01 VITALS — BP 138/95 | HR 88 | Temp 97.8°F | Resp 18 | Ht 70.0 in | Wt 224.8 lb

## 2021-04-01 DIAGNOSIS — Z95828 Presence of other vascular implants and grafts: Secondary | ICD-10-CM

## 2021-04-01 DIAGNOSIS — C187 Malignant neoplasm of sigmoid colon: Secondary | ICD-10-CM

## 2021-04-01 DIAGNOSIS — E119 Type 2 diabetes mellitus without complications: Secondary | ICD-10-CM | POA: Diagnosis not present

## 2021-04-01 DIAGNOSIS — C787 Secondary malignant neoplasm of liver and intrahepatic bile duct: Secondary | ICD-10-CM

## 2021-04-01 DIAGNOSIS — Z006 Encounter for examination for normal comparison and control in clinical research program: Secondary | ICD-10-CM

## 2021-04-01 DIAGNOSIS — R918 Other nonspecific abnormal finding of lung field: Secondary | ICD-10-CM

## 2021-04-01 DIAGNOSIS — Z86718 Personal history of other venous thrombosis and embolism: Secondary | ICD-10-CM

## 2021-04-01 DIAGNOSIS — R970 Elevated carcinoembryonic antigen [CEA]: Secondary | ICD-10-CM

## 2021-04-01 LAB — LIPID PANEL
Cholesterol: 160 mg/dL (ref 0–200)
HDL: 37 mg/dL — ABNORMAL LOW (ref >40)
LDL Cholesterol: 100 mg/dL — ABNORMAL HIGH (ref 0–99)
Total CHOL/HDL Ratio: 4.3 RATIO
Triglycerides: 116 mg/dL (ref <150)
VLDL: 23 mg/dL (ref 0–40)

## 2021-04-01 LAB — CBC WITH DIFFERENTIAL (CANCER CENTER ONLY)
Abs Immature Granulocytes: 0.04 10*3/uL (ref 0.00–0.07)
Basophils Absolute: 0.1 10*3/uL (ref 0.0–0.1)
Basophils Relative: 1 %
Eosinophils Absolute: 0.6 10*3/uL — ABNORMAL HIGH (ref 0.0–0.5)
Eosinophils Relative: 8 %
HCT: 41.3 % (ref 39.0–52.0)
Hemoglobin: 14 g/dL (ref 13.0–17.0)
Immature Granulocytes: 1 %
Lymphocytes Relative: 18 %
Lymphs Abs: 1.4 10*3/uL (ref 0.7–4.0)
MCH: 27.7 pg (ref 26.0–34.0)
MCHC: 33.9 g/dL (ref 30.0–36.0)
MCV: 81.8 fL (ref 80.0–100.0)
Monocytes Absolute: 0.7 10*3/uL (ref 0.1–1.0)
Monocytes Relative: 9 %
Neutro Abs: 4.8 10*3/uL (ref 1.7–7.7)
Neutrophils Relative %: 63 %
Platelet Count: 141 10*3/uL — ABNORMAL LOW (ref 150–400)
RBC: 5.05 MIL/uL (ref 4.22–5.81)
RDW: 14.6 % (ref 11.5–15.5)
WBC Count: 7.7 10*3/uL (ref 4.0–10.5)
nRBC: 0 % (ref 0.0–0.2)

## 2021-04-01 LAB — CMP (CANCER CENTER ONLY)
ALT: 37 U/L (ref 0–44)
AST: 30 U/L (ref 15–41)
Albumin: 2.9 g/dL — ABNORMAL LOW (ref 3.5–5.0)
Alkaline Phosphatase: 399 U/L — ABNORMAL HIGH (ref 38–126)
Anion gap: 14 (ref 5–15)
BUN: 17 mg/dL (ref 6–20)
CO2: 20 mmol/L — ABNORMAL LOW (ref 22–32)
Calcium: 9.7 mg/dL (ref 8.9–10.3)
Chloride: 100 mmol/L (ref 98–111)
Creatinine: 1.32 mg/dL — ABNORMAL HIGH (ref 0.61–1.24)
GFR, Estimated: >60 mL/min (ref >60)
Glucose, Bld: 257 mg/dL — ABNORMAL HIGH (ref 70–99)
Potassium: 4.7 mmol/L (ref 3.5–5.1)
Sodium: 134 mmol/L — ABNORMAL LOW (ref 135–145)
Total Bilirubin: 1.3 mg/dL — ABNORMAL HIGH (ref 0.3–1.2)
Total Protein: 6.9 g/dL (ref 6.5–8.1)

## 2021-04-01 LAB — URINALYSIS, COMPLETE (UACMP) WITH MICROSCOPIC
Bilirubin Urine: NEGATIVE
Glucose, UA: 50 mg/dL — AB
Hgb urine dipstick: NEGATIVE
Ketones, ur: NEGATIVE mg/dL
Leukocytes,Ua: NEGATIVE
Nitrite: NEGATIVE
Protein, ur: 100 mg/dL — AB
Specific Gravity, Urine: 1.026 (ref 1.005–1.030)
pH: 5 (ref 5.0–8.0)

## 2021-04-01 LAB — RESEARCH LABS

## 2021-04-01 LAB — T4, FREE: Free T4: 1.04 ng/dL (ref 0.61–1.12)

## 2021-04-01 LAB — PROTIME-INR
INR: 1 (ref 0.8–1.2)
Prothrombin Time: 12.9 seconds (ref 11.4–15.2)

## 2021-04-01 LAB — PHOSPHORUS: Phosphorus: 4 mg/dL (ref 2.5–4.6)

## 2021-04-01 LAB — BILIRUBIN, DIRECT: Bilirubin, Direct: 0.5 mg/dL — ABNORMAL HIGH (ref 0.0–0.2)

## 2021-04-01 LAB — CEA (IN HOUSE-CHCC): CEA (CHCC-In House): 722.88 ng/mL — ABNORMAL HIGH (ref 0.00–5.00)

## 2021-04-01 LAB — CK: Total CK: 50 U/L (ref 49–397)

## 2021-04-01 LAB — TSH: TSH: 1.139 u[IU]/mL (ref 0.320–4.118)

## 2021-04-01 LAB — MAGNESIUM: Magnesium: 1.6 mg/dL — ABNORMAL LOW (ref 1.7–2.4)

## 2021-04-01 MED ORDER — INV-SOTORASIB 120 MG TAB AMGEN 20190172: 960.0000 mg | ORAL_TABLET | Freq: Every day | ORAL | 0 refills | Status: DC

## 2021-04-01 MED ORDER — SODIUM CHLORIDE 0.9 % IV SOLN: Freq: Once | INTRAVENOUS | Status: AC

## 2021-04-01 MED ORDER — DOXYCYCLINE HYCLATE 100 MG PO CAPS: 100.0000 mg | ORAL_CAPSULE | Freq: Two times a day (BID) | ORAL | 6 refills | Status: DC

## 2021-04-01 MED ORDER — SODIUM CHLORIDE 0.9 % IV SOLN
6.0000 mg/kg | Freq: Once | INTRAVENOUS | Status: AC
  Administered 2021-04-01: 622 mg via INTRAVENOUS
  Filled 2021-04-01: qty 31.1

## 2021-04-01 MED ORDER — SODIUM CHLORIDE 0.9% FLUSH
10.0000 mL | Freq: Once | INTRAVENOUS | Status: AC
  Administered 2021-04-01: 10 mL via INTRAVENOUS

## 2021-04-01 MED ORDER — HEPARIN SOD (PORK) LOCK FLUSH 100 UNIT/ML IV SOLN
500.0000 [IU] | Freq: Once | INTRAVENOUS | Status: AC
  Administered 2021-04-01: 500 [IU] via INTRAVENOUS

## 2021-04-01 NOTE — Patient Instructions (Signed)
[facility]  Discharge Instructions: Thank you for choosing [facility] to provide your oncology and hematology care.   If you have a lab appointment with the [facility], please go directly to the [facility] and check in at the registration area.   Wear comfortable clothing and clothing appropriate for easy access to any Portacath or PICC line.   We strive to give you quality time with your provider. You may need to reschedule your appointment if you arrive late (15 or more minutes).  Arriving late affects you and other patients whose appointments are after yours.  Also, if you miss three or more appointments without notifying the office, you may be dismissed from the clinic at the provider's discretion.      For prescription refill requests, have your pharmacy contact our office and allow 72 hours for refills to be completed.    Today you received the following chemotherapy and/or immunotherapy agents Research panitumumab    To help prevent nausea and vomiting after your treatment, we encourage you to take your nausea medication as directed.  BELOW ARE SYMPTOMS THAT SHOULD BE REPORTED IMMEDIATELY: *FEVER GREATER THAN 100.4 F (38 C) OR HIGHER *CHILLS OR SWEATING *NAUSEA AND VOMITING THAT IS NOT CONTROLLED WITH YOUR NAUSEA MEDICATION *UNUSUAL SHORTNESS OF BREATH *UNUSUAL BRUISING OR BLEEDING *URINARY PROBLEMS (pain or burning when urinating, or frequent urination) *BOWEL PROBLEMS (unusual diarrhea, constipation, pain near the anus) TENDERNESS IN MOUTH AND THROAT WITH OR WITHOUT PRESENCE OF ULCERS (sore throat, sores in mouth, or a toothache) UNUSUAL RASH, SWELLING OR PAIN  UNUSUAL VAGINAL DISCHARGE OR ITCHING   Items with * indicate a potential emergency and should be followed up as soon as possible or go to the Emergency Department if any problems should occur.  Please show the CHEMOTHERAPY ALERT CARD or IMMUNOTHERAPY ALERT CARD at  check-in to the Emergency Department and triage nurse.  Should you have questions after your visit or need to cancel or reschedule your appointment, please contact [facility]  Dept: [phone]  and follow the prompts.  Office hours are 8:00 a.m. to 4:30 p.m. Monday - Friday. Please note that voicemails left after 4:00 p.m. may not be returned until the following business day.  We are closed weekends and major holidays. You have access to a nurse at all times for urgent questions. Please call the main number to the clinic Dept: [phone] and follow the prompts.   For any non-urgent questions, you may also contact your provider using MyChart. We now offer e-Visits for anyone 98 and older to request care online for non-urgent symptoms. For details visit [email or web address].   Also download the MyChart app! Go to the app store, search "MyChart", open the app, select [facility], and log in with your MyChart username and password.  Due to Covid, a mask is required upon entering the hospital/clinic. If you do not have a mask, one will be given to you upon arrival. For doctor visits, patients may have 1 support person aged 88 or older with them. For treatment visits, patients cannot have anyone with them due to current Covid guidelines and our immunocompromised population.

## 2021-04-01 NOTE — Progress Notes (Signed)
Pharmacist Chemotherapy Monitoring - Initial Assessment    Anticipated start date: 04/01/2021   The following has been reviewed per standard work regarding the patient's treatment regimen: The patient's diagnosis, treatment plan and drug doses, and organ/hematologic function Lab orders and baseline tests specific to treatment regimen  The treatment plan start date, drug sequencing, and pre-medications Prior authorization status  Patient's documented medication list, including drug-drug interaction screen and prescriptions for anti-emetics and supportive care specific to the treatment regimen The drug concentrations, fluid compatibility, administration routes, and timing of the medications to be used The patient's access for treatment and lifetime cumulative dose history, if applicable  The patient's medication allergies and previous infusion related reactions, if applicable   Changes made to treatment plan:  N/A  Follow up needed:  N/A   Liz Beach, RPH, 04/01/2021  1:52 PM

## 2021-04-01 NOTE — Progress Notes (Signed)
TRIAL: [place] / "A Phase 3 Multicenter, Randomized, Openlabel, Active-controlled Study of Sotorasib and Panitumumab Versus Investigator's Choice (Trifluridine and Tipiracil, or Regorafenib) for the Treatment of Previously Treated Metastatic Colorectal Cancer Subjects with KRAS p.G12C Mutation."  Cycle 1/Day 1 Patient presented alone to [facility] for morning lab appointment for the start of cycle 1, day 1. I met with patient prior to lab draw. Patient reports to be doing well and denies having any new issues since his last visit with research. Pre-treatment labs drawn as per protocol. Patient was presented with study provided ipad for completion of cycle 1 questionnaires. Patient was reminded to be at [facility] for his 10 AM appointment with Dr. Benay Bell. Patient was provided with the copy of his re-consent, signed on 03/22/2021, for his records.   Labs: 0740 Pre treatment labs, including research specimen collection, completed per study protocol. PRO's: cycle 1 day 1 questionnaires completed on study provided electronic ipad device, after lab draw.   MD Appointment at [place]:  Patient arrived to [facility] for MD appointment scheduled for 10 AM. I met with patient in lobby prior to patient's MD appointment.  Vital signs: collected and documented, as required per protocol.  ECG: Collected after patient had a calm five minute rest in supine position. MD reviewed result, initialed and dated report.  H&P: Dr. Benay Bell assessed patient and reviewed any adverse events and labs; CBC, Chemistry and Coagulation, prior to treatment. MD cleared patient to start cycle 1 day 1 of treatment. MD also prescribed patient doxycycline and patient was instructed to start today. Patient educated on wearing sunscreen, keeping skin moisturized and minimizing sun exposure if not wearing sunscreen, due to doxycycline increases sun sensitivity. Patient  gave his verbal understanding to this.  Adverse Events/SAE's:  Patient with no SAE's and no new adverse events to report today. Baseline AE's listed below. Research: Patient was educated on ediary and provided guide to take home for reference on daily documentation. Patient also set alarm on device for 6 PM to remind him to take Sotorasib, as instructed. Patient states he has dinner at 6 PM and this will help him to remember to take the study drug. I also reviewed with patient dosing instructions and how and when to take Sotorasib, what foods to avoid and what to do should vomiting occur. Patient was provided with a paper copy of these dosing instructions to take home for reference.   Tobacco and alcohol history collected today and patient reports no current tobacco use, states he stopped smoking approximately 16 year ago, reports to have started at approximately the age of 89. Patient states he seldom drinks alcohol, when he does he drinks about a 16oz beer and reports he may have a beer once ever three months. Patient states that he started drinking at approximately 50 y.o.  Required PK's were drawn via PIV, 1 hour post dose Sotorasib, 2 hr pose dose and 4 hours post dose.  Required PK drawn for panitumumab at end of infusion, just prior to completion.  Treatment: After MD cleared patient to start treatment, I provided patient with two (2) bottles of study drug, Sotorasib 120 mg each tab, RX 2537-0, expires 03/31/02023, that was dispensed by [facility]. Patient instructed to take 8 pills for a total of 925m. Patient took all 8 pills in clinic, in the presence of this nurse, @ 11:15. Patient was then instructed to document the dosage in the study provided ediary. Patient had completed the patient training for ediary and knows  how to enter daily dosage. There was an issue with data entry and were unable to document a total of 8 pills, only 5, in the ediary. Study was notified of the issue with an email sent to acting study monitor, Herminio Heads, that the correct dosage was  taken and the issue with the ediary.  Patient was observed for two hours prior to administration of panitumumab. Panitumumab was released by me and because of that, administration of drug is under my name. Infusion nurse was unable to release and document administration of it. Panitumumab was verified by two chemo certified nurses. Please see MAR documentation, reflecting this.  PK specimen draws: Pre-treatment drawn in morning with other required labs, before any study treatment was given at 0756 1 hour post drawn @ 12:15, 2 hours post drawn @ 1:15, 4 hour post drawn @ 15:20. EOI for panitumumab drawn @ 14:20.    Plan: Patient was instructed to return tomorrow to [facility] cancer center for the 24 hr Sotorasib, post dose PK at 11:15 AM. Patient also instructed not to take Sotorasib prior to [place] lab appointment. Patient, instructed that on appointment clinic days, he is not to take study drug Sotorasib at home, but to wait and take in clinic. Patient gave verbal understanding to these instructions. Patient was thanked for his time and was encouraged to call Dr. Benay Bell or myself with questions/concerns. All patient's questions answered to his satisfaction today and he denies further questions at this time.    [name], RN, BSN, [facility] Clinical Research Nurse Lead 04/01/2021 4:54 PM    Dan Bell [id number]  04/01/2021  Adverse Event Log CTCAE v5.0  Study/Protocol: [employer] Codebreak Cycle: Baseline- Cycle 1/Day 1 H/O: HTN-Gr2; Proteinuria-Gr1; Glucosuria- Gr1; hyponatremia- Gr1; Creatinine, elevated- Gr1; ALT, elevated- Gr1; ALK Phosphatase, elevated-Gr2; direct bilirubin, elevated- Gr1; platelet, decreased- Gr1; Hypomagnesemia- Gr 1; Diabetes mellitus, type II; hypoalbuminemia, Gr2; total bilirubin, elevated- Gr1;   Event Grade Onset Date Resolved Date Sotorasib Panitumumab  Attribution Treatment Comments

## 2021-04-01 NOTE — Progress Notes (Signed)
Patient presents for treatment. RN assessment completed along with the following:  Labs/vitals reviewed - Yes, and within treatment parameters.   Weight within 10% of previous measurement - Yes Oncology Treatment Attestation completed for current therapy- No, message sent to provider regarding need for treatment attestation completion. Informed consent completed and reflects current therapy/intent - Yes, on date 03/22/21 (research), 04/01/21 (infusion)             Provider progress note reviewed - Today's provider note is not yet available. I reviewed the most recent oncology provider progress note in chart dated 03/22/21. Treatment/Antibody/Supportive plan reviewed - Yes, and there are no adjustments needed for today's treatment. S&H and other orders reviewed - Yes, and there are no additional orders identified. Previous treatment date reviewed - Yes, and the appropriate amount of time has elapsed between treatments.   Patient to proceed with treatment.

## 2021-04-01 NOTE — Progress Notes (Signed)
  [place] OFFICE PROGRESS NOTE   Diagnosis: Colon cancer  INTERVAL HISTORY:   Dan Bell returns as scheduled.  He feels well.  No complaint.  He is working.  He has enrolled on the [employer] 300 study.  He is here today to initiate therapy.  Objective:  Vital signs in last 24 hours:  Blood pressure (!) 138/95, pulse 88, temperature 97.8 F (36.6 C), temperature source Oral, resp. rate 18, height $RemoveBe'5\' 10"'bfPGsOEql$  (1.778 m), weight 224 lb 12.8 oz (102 kg), SpO2 100 %.     Resp: Lungs clear bilaterally Cardio: Regular rate and rhythm GI: No hepatosplenomegaly, nontender Vascular: No leg edema  Portacath/PICC-without erythema  Lab Results:  Lab Results  Component Value Date   WBC 7.7 04/01/2021   HGB 14.0 04/01/2021   HCT 41.3 04/01/2021   MCV 81.8 04/01/2021   PLT 141 (L) 04/01/2021   NEUTROABS 4.8 04/01/2021    CMP  Lab Results  Component Value Date   NA 134 (L) 04/01/2021   K 4.7 04/01/2021   CL 100 04/01/2021   CO2 20 (L) 04/01/2021   GLUCOSE 257 (H) 04/01/2021   BUN 17 04/01/2021   CREATININE 1.32 (H) 04/01/2021   CALCIUM 9.7 04/01/2021   PROT 6.9 04/01/2021   ALBUMIN 2.9 (L) 04/01/2021   AST 30 04/01/2021   ALT 37 04/01/2021   ALKPHOS 399 (H) 04/01/2021   BILITOT 1.3 (H) 04/01/2021   GFRNONAA >60 04/01/2021    Lab Results  Component Value Date   CEA1 722.88 (H) 04/01/2021      Medications: I have reviewed the patient's current medications.   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 and CDX2  positive FOLFOX plus bevacizumab, cycle 1 08/24/2019, 11 total cycles, oxaliplatin held with cycle 11 secondary to a reaction with cycle 10 FOLFOX plus bevacizumab discontinued after cycle 19 secondary to disease progression FOLFIRI plus bevacizumab beginning 06/20/2020, status post 12 cycles Disease progression on CT 02/11/2021-enlarging lung nodules and liver lesions CEA increased 02/13/2021 CTs 03/11/2021-bulky hypodense liver metastases, slightly progressive, enlargement of bilateral lung nodules, slight enlargement of mediastinal nodes, splenomegaly and gastroesophageal varices Sotorasib/panitumumab on the Codebreak study beginning 04/01/2021  Type 2 diabetes Left lower extremity DVT 2018 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  He has been randomized to the sotorasib/panitumumab arm of the study.  He will begin treatment today.  He will begin doxycycline prophylaxis.  He has an ECOG performance status of 0.  He will return for an office visit and panitumumab in 2 weeks.  He will call in the interim for new symptoms.  The labs from today appear adequate to begin treatment.  Dan Coder, MD  04/01/2021  4:53 PM

## 2021-04-02 ENCOUNTER — Inpatient Hospital Stay: Payer: Managed Care, Other (non HMO)

## 2021-04-02 ENCOUNTER — Encounter: Payer: Self-pay | Admitting: Oncology

## 2021-04-02 DIAGNOSIS — C187 Malignant neoplasm of sigmoid colon: Secondary | ICD-10-CM

## 2021-04-02 LAB — PTT FACTOR INHIBITOR (MIXING STUDY): aPTT: 26.3 s (ref 22.9–30.2)

## 2021-04-02 LAB — RESEARCH LABS

## 2021-04-02 LAB — T3, FREE: T3, Free: 2.5 pg/mL (ref 2.0–4.4)

## 2021-04-02 NOTE — Progress Notes (Signed)
04/02/21 - [employer] CodeBreak Study - Patient into the cancer center this morning for his research blood draw.  Research lab was drawn for C1 day 2.  Patient stated that he did not take study drug sotorasib prior to lab draw and stated he will take it this evening at 6 pm. [name] 04/02/21

## 2021-04-03 ENCOUNTER — Telehealth: Payer: Self-pay | Admitting: Medical Oncology

## 2021-04-03 NOTE — Telephone Encounter (Signed)
TRIAL: [place]. / "A Phase 3 Multicenter, Randomized, Openlabel, Active-controlled Study of Sotorasib and Panitumumab Versus Investigator's Choice (Trifluridine and Tipiracil, or Regorafenib) for the Treatment of Previously Treated Metastatic Colorectal Cancer Subjects with KRAS p.G12C Mutation."  Outgoing call: Received VM from patient last night after hours, patient stating he was only able to document taking one pill in the study provided ediary, for last nights dosage (04/03/21).  Return call to patient this morning. Patient informed me that he had figured out how to add the correct number of study drug taken. Patient confirms that he took the required dosage of 8 pills of Sotorasib, for a total of 960 mg daily dosage. Patient states that to enter the correct number of pills in the ediary device, he has to enter a "0" then "8" to document that he has taken 8 pills. I confirmed with patient that he is doing well, he states having no new issues and knows to call with questions/concerns.  Patient thanked for keeping me informed.  [name], RN, BSN, [facility] Clinical Research Nurse Lead 04/03/2021 11:26 AM

## 2021-04-09 ENCOUNTER — Other Ambulatory Visit: Payer: Self-pay

## 2021-04-09 ENCOUNTER — Encounter: Payer: Self-pay | Admitting: Oncology

## 2021-04-09 DIAGNOSIS — C187 Malignant neoplasm of sigmoid colon: Secondary | ICD-10-CM

## 2021-04-09 NOTE — Progress Notes (Signed)
Order review per phlebotomy

## 2021-04-10 ENCOUNTER — Other Ambulatory Visit: Payer: Self-pay | Admitting: Medical Oncology

## 2021-04-10 ENCOUNTER — Encounter: Payer: Self-pay | Admitting: Oncology

## 2021-04-10 DIAGNOSIS — C187 Malignant neoplasm of sigmoid colon: Secondary | ICD-10-CM

## 2021-04-11 ENCOUNTER — Telehealth: Payer: Self-pay | Admitting: Medical Oncology

## 2021-04-11 NOTE — Telephone Encounter (Signed)
TRIAL: [place] / "A Phase 3 Multicenter, Randomized, Openlabel, Active-controlled Study of Sotorasib and Panitumumab Versus Investigator's Choice (Trifluridine and Tipiracil, or Regorafenib) for the Treatment of Previously Treated Metastatic Colorectal Cancer Subjects with KRAS p.G12C Mutation."  Outgoing Call: Call to patient to see how he was doing and to clarify medical history and medication and to inform him of new consent form that I need to review with him and re-consent him prior to any study required assessments.   Patient states he is doing well, denies having nausea, vomiting or diarrhea. Patient states he has started to develop some acne and noticed his face was turning reddish on Monday (10/24) and on Tuesday (10/25) he states he was starting to develop some acne. I confirmed with patient that he is taking his doxycycline as directed and he started it on Monday (10/17). I also confirmed with patient that he is protecting his skin from the sun.  Medical History: Patient states his diabetes II diagnosis was in 2016, but he cannot remember the exact month.  Concomitant Medications: Patient reports to have started Insulin Glargine in September of 2021, Humalog started in March of 2022 and Metformin was started in 2016.  Smoking History: Clarified, patient reports that he used to have smoked 2 packs per day, patient has since quit smoking. PK Oral Dosing: Patient had the post 24-hour Sotorasib PK drawn on the 18th (day 2 of Cycle 1), per lab at 11:07. Patient states his last food intake, prior to the 24-hour PK draw, was lunch that day, he took his oral Sotorasib at approximately 1805 that evening, just before he had dinner. Patient confirms he did not have diarrhea or emesis.  I confirmed with patient his appointment for Monday 10/31st for cycle 1 day 15 with panitumumab. Patient knows to arrive to the [facility], at [phone] for review of ICF and re-consent, lab draw, followed by  appointments at [facility].  Patient was re-educated on not to take oral Sotorasib at home, but to bring the bottle with him to take in clinic, to bring the electronic diary so to document time taken and that he will have PK's drawn again as he did at Cycle 1, day 1.  Patient denied having any questions at this time. Patient was thanked for his time and encouraged to call with any questions/concerns.   [name], RN, BSN, [place] Clinical Research Nurse Lead 04/11/2021 3:16 PM   Fax received from [facility], [facility], this afternoon, with patient's chemo-therapy history.

## 2021-04-15 ENCOUNTER — Inpatient Hospital Stay: Payer: Managed Care, Other (non HMO)

## 2021-04-15 ENCOUNTER — Encounter: Payer: Self-pay | Admitting: Medical Oncology

## 2021-04-15 ENCOUNTER — Other Ambulatory Visit: Payer: Self-pay

## 2021-04-15 ENCOUNTER — Encounter: Payer: Self-pay | Admitting: Nurse Practitioner

## 2021-04-15 ENCOUNTER — Inpatient Hospital Stay (HOSPITAL_BASED_OUTPATIENT_CLINIC_OR_DEPARTMENT_OTHER): Payer: Managed Care, Other (non HMO) | Admitting: Nurse Practitioner

## 2021-04-15 VITALS — BP 141/90 | HR 84 | Temp 98.7°F | Resp 18 | Ht 70.0 in | Wt 225.8 lb

## 2021-04-15 DIAGNOSIS — C187 Malignant neoplasm of sigmoid colon: Secondary | ICD-10-CM

## 2021-04-15 DIAGNOSIS — Z95828 Presence of other vascular implants and grafts: Secondary | ICD-10-CM

## 2021-04-15 DIAGNOSIS — Z006 Encounter for examination for normal comparison and control in clinical research program: Secondary | ICD-10-CM

## 2021-04-15 LAB — CBC WITH DIFFERENTIAL (CANCER CENTER ONLY)
Abs Immature Granulocytes: 0.02 10*3/uL (ref 0.00–0.07)
Basophils Absolute: 0.1 10*3/uL (ref 0.0–0.1)
Basophils Relative: 1 %
Eosinophils Absolute: 0.3 10*3/uL (ref 0.0–0.5)
Eosinophils Relative: 6 %
HCT: 43.1 % (ref 39.0–52.0)
Hemoglobin: 14.1 g/dL (ref 13.0–17.0)
Immature Granulocytes: 0 %
Lymphocytes Relative: 23 %
Lymphs Abs: 1.3 10*3/uL (ref 0.7–4.0)
MCH: 27.4 pg (ref 26.0–34.0)
MCHC: 32.7 g/dL (ref 30.0–36.0)
MCV: 83.9 fL (ref 80.0–100.0)
Monocytes Absolute: 0.5 10*3/uL (ref 0.1–1.0)
Monocytes Relative: 8 %
Neutro Abs: 3.4 10*3/uL (ref 1.7–7.7)
Neutrophils Relative %: 62 %
Platelet Count: 86 10*3/uL — ABNORMAL LOW (ref 150–400)
RBC: 5.14 MIL/uL (ref 4.22–5.81)
RDW: 15.1 % (ref 11.5–15.5)
WBC Count: 5.5 10*3/uL (ref 4.0–10.5)
nRBC: 0 % (ref 0.0–0.2)

## 2021-04-15 LAB — CMP (CANCER CENTER ONLY)
ALT: 26 U/L (ref 0–44)
AST: 20 U/L (ref 15–41)
Albumin: 3.4 g/dL — ABNORMAL LOW (ref 3.5–5.0)
Alkaline Phosphatase: 154 U/L — ABNORMAL HIGH (ref 38–126)
Anion gap: 7 (ref 5–15)
BUN: 17 mg/dL (ref 6–20)
CO2: 26 mmol/L (ref 22–32)
Calcium: 9.4 mg/dL (ref 8.9–10.3)
Chloride: 103 mmol/L (ref 98–111)
Creatinine: 1.17 mg/dL (ref 0.61–1.24)
GFR, Estimated: >60 mL/min (ref >60)
Glucose, Bld: 155 mg/dL — ABNORMAL HIGH (ref 70–99)
Potassium: 4.4 mmol/L (ref 3.5–5.1)
Sodium: 136 mmol/L (ref 135–145)
Total Bilirubin: 0.7 mg/dL (ref 0.3–1.2)
Total Protein: 6.6 g/dL (ref 6.5–8.1)

## 2021-04-15 LAB — BILIRUBIN, DIRECT: Bilirubin, Direct: 0.2 mg/dL (ref 0.0–0.2)

## 2021-04-15 LAB — RESEARCH LABS

## 2021-04-15 LAB — MAGNESIUM: Magnesium: 1.6 mg/dL — ABNORMAL LOW (ref 1.7–2.4)

## 2021-04-15 LAB — CK: Total CK: 35 U/L — ABNORMAL LOW (ref 49–397)

## 2021-04-15 LAB — PHOSPHORUS: Phosphorus: 4 mg/dL (ref 2.5–4.6)

## 2021-04-15 MED ORDER — SODIUM CHLORIDE 0.9 % IV SOLN
6.0000 mg/kg | Freq: Once | INTRAVENOUS | Status: AC
  Administered 2021-04-15: 612 mg via INTRAVENOUS
  Filled 2021-04-15: qty 30.6

## 2021-04-15 MED ORDER — SODIUM CHLORIDE 0.9 % IV SOLN
6.0000 mg/kg | Freq: Once | INTRAVENOUS | Status: DC
  Filled 2021-04-15: qty 31.1

## 2021-04-15 MED ORDER — SODIUM CHLORIDE 0.9 % IV SOLN: Freq: Once | INTRAVENOUS | Status: AC

## 2021-04-15 MED ORDER — HEPARIN SOD (PORK) LOCK FLUSH 100 UNIT/ML IV SOLN: 500.0000 [IU] | Freq: Once | INTRAVENOUS | Status: DC | PRN

## 2021-04-15 MED ORDER — SODIUM CHLORIDE 0.9% FLUSH
10.0000 mL | Freq: Once | INTRAVENOUS | Status: AC
  Administered 2021-04-15: 10 mL via INTRAVENOUS

## 2021-04-15 MED ORDER — SODIUM CHLORIDE 0.9% FLUSH: 10.0000 mL | INTRAVENOUS | Status: DC | PRN

## 2021-04-15 NOTE — Progress Notes (Signed)
[place] OFFICE PROGRESS NOTE   Diagnosis: Colon cancer  INTERVAL HISTORY:   Dan Bell returns as scheduled.  He began sotorasib/Panitumumab 04/01/2021.  Since last office visit he has had a single episode of nausea/vomiting.  He relates this to overeating.  No mouth sores.  No diarrhea.  No cough or shortness of breath.  No eye symptoms.  He denies pain.  He has a good appetite.  No bleeding.  He has a rash on his face, scalp and chest.  Scalp is pruritic, tender.  He is applying a moisturizer.  Objective:  Vital signs in last 24 hours:  Blood pressure (!) 141/90, pulse 84, temperature 98.7 F (37.1 C), temperature source Oral, resp. rate 18, height '5\' 10"'  (1.778 m), weight 225 lb 12.8 oz (102.4 kg), SpO2 100 %.    HEENT: No thrush or ulcers. Resp: Lungs clear bilaterally. Cardio: Regular rate and rhythm. GI: Abdomen soft and nontender.  No hepatomegaly. Vascular: No leg edema. Skin: Papulopustular skin rash face, scalp, upper chest, upper and lower back. Port-A-Cath without erythema.  Lab Results:  Lab Results  Component Value Date   WBC 5.5 04/15/2021   HGB 14.1 04/15/2021   HCT 43.1 04/15/2021   MCV 83.9 04/15/2021   PLT 86 (L) 04/15/2021   NEUTROABS 3.4 04/15/2021     Medications: I have reviewed the patient's current medications.  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 and CDX2 positive FOLFOX plus bevacizumab, cycle 1 08/24/2019, 11 total cycles, oxaliplatin held with cycle 11 secondary to a reaction with  cycle 10 FOLFOX plus bevacizumab discontinued after cycle 19 secondary to disease progression FOLFIRI plus bevacizumab beginning 06/20/2020, status post 12 cycles Disease progression on CT 02/11/2021-enlarging lung nodules and liver lesions CEA increased 02/13/2021 CTs 03/11/2021-bulky hypodense liver metastases, slightly progressive, enlargement of bilateral lung nodules, slight enlargement of mediastinal nodes, splenomegaly and gastroesophageal varices Sotorasib/panitumumab on the Codebreak study beginning 04/01/2021 Cycle 1 day 15 04/15/2021   Type 2 diabetes Left lower extremity DVT 2018 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  He began sotorasib/Panitumumab 04/01/2021.  Overall tolerating well.  Plan to proceed with cycle 1 day 15 today as scheduled.    Has a grade 2 skin rash on exam today.  He will try hydrocortisone 1% cream on his face, avoiding the skin around his eyes and lips.  We discussed applying a moisturizer and utilizing sunscreen.  He expressed understanding.  CBC and chemistry panel reviewed.  Labs adequate to proceed with treatment.  He has progressive thrombocytopenia, question related to prior treatment.  He understands to contact the office with bleeding.  He will return for lab, follow-up, cycle 2-day 1 sotorasib/Panitumumab in 2 weeks.  He will contact the office in the interim with any problems.  Patient seen with Dr. Benay Spice.    Ned Card ANP/GNP-BC   04/15/2021  9:23 AM  This was a shared visit with Ned Card.  Dan Bell was interviewed and examined.  He has developed a panitumumab skin rash.  He will continue doxycycline and begin a trial of hydrocortisone cream.  His ECOG performance  status is measured at 1 today.  I was present for greater than 50% of today's visit.  I performed medical decision making.  Julieanne Manson,  MD

## 2021-04-15 NOTE — Patient Instructions (Signed)
[facility]  Discharge Instructions: Thank you for choosing [facility] to provide your oncology and hematology care.   If you have a lab appointment with the [facility], please go directly to the [facility] and check in at the registration area.   Wear comfortable clothing and clothing appropriate for easy access to any Portacath or PICC line.   We strive to give you quality time with your provider. You may need to reschedule your appointment if you arrive late (15 or more minutes).  Arriving late affects you and other patients whose appointments are after yours.  Also, if you miss three or more appointments without notifying the office, you may be dismissed from the clinic at the provider's discretion.      For prescription refill requests, have your pharmacy contact our office and allow 72 hours for refills to be completed.    Today you received the following chemotherapy and/or immunotherapy agents Research panitumumab    To help prevent nausea and vomiting after your treatment, we encourage you to take your nausea medication as directed.  BELOW ARE SYMPTOMS THAT SHOULD BE REPORTED IMMEDIATELY: *FEVER GREATER THAN 100.4 F (38 C) OR HIGHER *CHILLS OR SWEATING *NAUSEA AND VOMITING THAT IS NOT CONTROLLED WITH YOUR NAUSEA MEDICATION *UNUSUAL SHORTNESS OF BREATH *UNUSUAL BRUISING OR BLEEDING *URINARY PROBLEMS (pain or burning when urinating, or frequent urination) *BOWEL PROBLEMS (unusual diarrhea, constipation, pain near the anus) TENDERNESS IN MOUTH AND THROAT WITH OR WITHOUT PRESENCE OF ULCERS (sore throat, sores in mouth, or a toothache) UNUSUAL RASH, SWELLING OR PAIN  UNUSUAL VAGINAL DISCHARGE OR ITCHING   Items with * indicate a potential emergency and should be followed up as soon as possible or go to the Emergency Department if any problems should occur.  Please show the CHEMOTHERAPY ALERT CARD or IMMUNOTHERAPY ALERT CARD at  check-in to the Emergency Department and triage nurse.  Should you have questions after your visit or need to cancel or reschedule your appointment, please contact [facility]  Dept: [phone]  and follow the prompts.  Office hours are 8:00 a.m. to 4:30 p.m. Monday - Friday. Please note that voicemails left after 4:00 p.m. may not be returned until the following business day.  We are closed weekends and major holidays. You have access to a nurse at all times for urgent questions. Please call the main number to the clinic Dept: [phone] and follow the prompts.   For any non-urgent questions, you may also contact your provider using MyChart. We now offer e-Visits for anyone 76 and older to request care online for non-urgent symptoms. For details visit [email or web address].   Also download the MyChart app! Go to the app store, search "MyChart", open the app, select [facility], and log in with your MyChart username and password.  Due to Covid, a mask is required upon entering the hospital/clinic. If you do not have a mask, one will be given to you upon arrival. For doctor visits, patients may have 1 support person aged 74 or older with them. For treatment visits, patients cannot have anyone with them due to current Covid guidelines and our immunocompromised population.

## 2021-04-15 NOTE — Progress Notes (Signed)
Patient presents for treatment. RN assessment completed along with the following:  Labs/vitals reviewed - Yes, and per Ned Card, NP ok to treat with Mg and platelets    Weight within 10% of previous measurement - Yes Oncology Treatment Attestation completed for current therapy- Yes, on date 04/01/21 Informed consent completed and reflects current therapy/intent - Yes, on date 04/01/21             Provider progress note reviewed - Today's provider note is not yet available. I reviewed the most recent oncology provider progress note in chart dated 04/01/21. Treatment/Antibody/Supportive plan reviewed - Yes, and there are no adjustments needed for today's treatment. S&H and other orders reviewed - Yes, and there are no additional orders identified. Previous treatment date reviewed - Yes, and the appropriate amount of time has elapsed between treatments. Clinic Hand Off Received from - Ned Card, NP  Patient to proceed with treatment.

## 2021-04-15 NOTE — Progress Notes (Signed)
TRIAL: [name] / "A Phase 3 Multicenter, Randomized, Openlabel, Active-controlled Study of Sotorasib and Panitumumab Versus Investigator's Choice (Trifluridine and Tipiracil, or Regorafenib) for the Treatment of Previously Treated Metastatic Colorectal Cancer Subjects with KRAS p.G12C Mutation."  Re-consent:  Patient arrived early, before any study assessments were collected to review and re-consent new study consent form, [place] Main ICF V3.0 22Sep2022.  Patient and I reviewed all of version 3 consent form changes/updates. Patient expressed understanding to these changes and denies having any questions. Once patient and I reviewed the new information, patient proceeded to sign and date where indicated. Patient also gave his consent to use samples for future research, use of samples for Pharmacogenetic research, to participate in optional tumor biopsy, consent to site to alternate visits and consent to site to local lab changes. Approximately twenty minutes was spent re-consenting patient this morning. Patient was provided with a copy of the signed version 3 ICF for his record.    Cycle 1/Day 15 Patient presented alone to [facility] for morning lab appointment for the start of cycle 1, day 15. I met with patient prior to lab draw. Patient reports to be doing well and reports an increase to skin rash. Pre-treatment labs drawn as per protocol. Patient was reminded to be at [place] for his 0915 AM appointment with Ned Card, NP    Labs: Pre treatment labs collected at 0820, including research specimen collection, completed per study protocol. PRO's: None required for this visit.   NP Appointment at [place]:  Patient arrived to [facility] for NP appointment scheduled this morning. I met with patient in lobby prior to patient's clinic appointment.  Vital signs: collected and documented, as required per protocol.  ECG: Not required at this visit.  H&P: NP, Ned Card assessed patient and reviewed  any adverse events and labs; CBC, Chemistry, prior to treatment. MD reviewed labs and assessed patient's rash, lab results deemed to be not clinically significant and MD cleared patient to start cycle 1 day 15 of treatment. Patient reminded again about wearing sunscreen, keeping skin moisturized and minimizing sun exposure if not wearing sunscreen, due to doxycycline increases sun sensitivity. Patient  gave his verbal understanding to this.  Adverse Events/SAE's: Patient with no SAE's. Patient presents with Grade 2 papulopustual rash, assessed by MD. Patient denies any other adverse events.  Research: Required PK's were drawn via PIV, 1 hour post dose Sotorasib@ 11:42, 2 hr pose dose @ 12:42and 4 hours post dose @ 1435.  ECOG: patient assessed at an ECOG score of 1. Treatment: After patient was cleared to start treatment, Patient instructed to take sotorasib, 8 pills for a total of 972m. Patient took all 8 pills in clinic, in the presence of this nurse, @ 10:35. Patient was then instructed to document the dosage in the study provided ediary. Patient reports to have had a small meal approximately 1-2 hours before the pre-PK blood draw and food intake more than 2 hours after PK dose. Patient started on panitumumab, Lot # [id number] at 1217. Concomitant medications: patient to start hydrocortisone 1% cream for  Gr2 rash. No other changes to conmeds.  PK specimen draws: Pre-treatment drawn in morning with other required labs, before any study treatment was given at 0832 1 hour post drawn @ 1142, 2 hours post drawn @ 1242, 4 hour post drawn @ 1439.   Plan: [facility] S[facility]asib, post dose PK at 1030 AM. Patient also  instructed not to take Sotorasib prior to [place] lab appointment. Patient, instructed that on appointment clinic days, he is not to take study drug Sotorasib at home, but to wait and take in clinic. Patient gave verbal understanding  to these instructions. Patient was thanked for his time and was encouraged to call Dr. Benay Spice or myself with questions/concerns. All patient's questions answered to his satisfaction today and he denies further questions at this time. Patient's next scheduled visit to be in two weeks time for cycle 2, day 1.    Dan Marion, RN, BSN, [place] Clinical Research Nurse Lead 04/15/2021 11:13 AM    Dan Bell [id number]  04/15/2021  Adverse Event Log CTCAE v5.0  Study/Protocol: AMGEN Codebreak Cycle: Baseline- Cycle 1/Day 1 H/O: HTN-Gr2; Proteinuria-Gr1; Glucosuria- Gr1; hyponatremia- Gr1; Creatinine, elevated- Gr1; ALT, elevated- Gr1; ALK Phosphatase, elevated-Gr2; direct bilirubin, elevated- Gr1; platelet, decreased- Gr1; Hypomagnesemia- Gr 1; Diabetes mellitus, type II; hypoalbuminemia, Gr2; total bilirubin, elevated- Gr1;   Event Grade Onset Date Resolved Date Related to Sotorasib Related to Panitumumab  Treatment Comments  Papulopustular rash Gr 2 04/09/21 ongoing no yes OTC hydrocortisone  Doxycycline started 04/01/21

## 2021-04-16 ENCOUNTER — Inpatient Hospital Stay: Payer: Managed Care, Other (non HMO) | Attending: Oncology

## 2021-04-16 ENCOUNTER — Other Ambulatory Visit: Payer: Self-pay | Admitting: *Deleted

## 2021-04-16 DIAGNOSIS — D696 Thrombocytopenia, unspecified: Secondary | ICD-10-CM | POA: Insufficient documentation

## 2021-04-16 DIAGNOSIS — R21 Rash and other nonspecific skin eruption: Secondary | ICD-10-CM | POA: Insufficient documentation

## 2021-04-16 DIAGNOSIS — C187 Malignant neoplasm of sigmoid colon: Secondary | ICD-10-CM | POA: Insufficient documentation

## 2021-04-16 DIAGNOSIS — Z5112 Encounter for antineoplastic immunotherapy: Secondary | ICD-10-CM | POA: Insufficient documentation

## 2021-04-16 DIAGNOSIS — T451X5D Adverse effect of antineoplastic and immunosuppressive drugs, subsequent encounter: Secondary | ICD-10-CM | POA: Insufficient documentation

## 2021-04-16 DIAGNOSIS — Z23 Encounter for immunization: Secondary | ICD-10-CM | POA: Insufficient documentation

## 2021-04-16 DIAGNOSIS — Z006 Encounter for examination for normal comparison and control in clinical research program: Secondary | ICD-10-CM | POA: Insufficient documentation

## 2021-04-16 DIAGNOSIS — E119 Type 2 diabetes mellitus without complications: Secondary | ICD-10-CM | POA: Insufficient documentation

## 2021-04-16 DIAGNOSIS — Z86718 Personal history of other venous thrombosis and embolism: Secondary | ICD-10-CM | POA: Insufficient documentation

## 2021-04-16 LAB — RESEARCH LABS

## 2021-04-17 ENCOUNTER — Telehealth: Payer: Self-pay | Admitting: Medical Oncology

## 2021-04-17 ENCOUNTER — Encounter: Payer: Self-pay | Admitting: Oncology

## 2021-04-17 NOTE — Telephone Encounter (Signed)
TRIAL: [place] / "A Phase 3 Multicenter, Randomized, Openlabel, Active-controlled Study of Sotorasib and Panitumumab Versus Investigator's Choice (Trifluridine and Tipiracil, or Regorafenib) for the Treatment of Previously Treated Metastatic Colorectal Cancer Subjects with KRAS p.G12C Mutation."  Cycle 1/Day 16: outgoing call Spoke with patient regarding day 16/C1, 24-post sotorasib PK draw. Patient's 24-hour post PK draw completed at 10:26 AM on November 1st. Patient took 960 mg sotorasib in clinic on 10/31 at 10:35  AM. Patient reports to have had dinner his usual time, approximately 6:00 PM,that evening, breakfast the following morning @ approximately 9:30 AM . Patient confirms to have taken 11/1st scheduled sotorasib dose at approximately 6:20 PM with his evening dinner. Patient denies having any nausea/vomiting or diarrhea. Patient reports no other symptoms.  Patient confirms that he has started using the hydrocortisone cream 1% on 10/31, as instructed to the rash affected areas.  Patient denies questions at this time.  Patient thanked for his time and was encouraged to call with questions/concerns in the meantime. [name], RN, BSN, [facility] Clinical Research Nurse Lead 04/17/2021 1:04 PM

## 2021-04-17 NOTE — Telephone Encounter (Signed)
error 

## 2021-04-22 ENCOUNTER — Encounter: Payer: Self-pay | Admitting: Oncology

## 2021-04-23 ENCOUNTER — Other Ambulatory Visit: Payer: Self-pay | Admitting: Medical Oncology

## 2021-04-23 DIAGNOSIS — C187 Malignant neoplasm of sigmoid colon: Secondary | ICD-10-CM

## 2021-04-26 ENCOUNTER — Telehealth: Payer: Self-pay | Admitting: Medical Oncology

## 2021-04-26 NOTE — Telephone Encounter (Signed)
TRIAL: [place] / "A Phase 3 Multicenter, Randomized, Openlabel, Active-controlled Study of Sotorasib and Panitumumab Versus Investigator's Choice (Trifluridine and Tipiracil, or Regorafenib) for the Treatment of Previously Treated Metastatic Colorectal Cancer Subjects with KRAS p.G12C Mutation."   Outgoing Call: Confirmed Monday, 11/14th morning appointment at [place] for lab collection at the [place]. Patient asked to bring sotorasib medication bottles (2) that were dispensed at [place]. Patient informed me through one bottle away because it was empty, confirmed he will bring the second bottle for collection. Patient was informed 2 new sotorasib bottles will be dispensed Monday for C2. Patient denies questions at this time. Patient thanked.  [name], RN, BSN, [place] Clinical Research Nurse Lead 04/26/2021 2:33 PM

## 2021-04-28 ENCOUNTER — Other Ambulatory Visit: Payer: Self-pay | Admitting: Oncology

## 2021-04-29 ENCOUNTER — Inpatient Hospital Stay: Payer: Managed Care, Other (non HMO)

## 2021-04-29 ENCOUNTER — Other Ambulatory Visit: Payer: Self-pay

## 2021-04-29 ENCOUNTER — Other Ambulatory Visit: Payer: Managed Care, Other (non HMO)

## 2021-04-29 ENCOUNTER — Encounter: Payer: Self-pay | Admitting: Medical Oncology

## 2021-04-29 ENCOUNTER — Inpatient Hospital Stay (HOSPITAL_BASED_OUTPATIENT_CLINIC_OR_DEPARTMENT_OTHER): Payer: Managed Care, Other (non HMO) | Admitting: Oncology

## 2021-04-29 VITALS — BP 135/98 | HR 61 | Temp 97.8°F | Resp 18 | Ht 70.0 in | Wt 231.8 lb

## 2021-04-29 DIAGNOSIS — Z23 Encounter for immunization: Secondary | ICD-10-CM | POA: Diagnosis not present

## 2021-04-29 DIAGNOSIS — C187 Malignant neoplasm of sigmoid colon: Secondary | ICD-10-CM

## 2021-04-29 DIAGNOSIS — Z006 Encounter for examination for normal comparison and control in clinical research program: Secondary | ICD-10-CM | POA: Diagnosis present

## 2021-04-29 DIAGNOSIS — Z95828 Presence of other vascular implants and grafts: Secondary | ICD-10-CM

## 2021-04-29 DIAGNOSIS — Z86718 Personal history of other venous thrombosis and embolism: Secondary | ICD-10-CM | POA: Diagnosis not present

## 2021-04-29 DIAGNOSIS — E119 Type 2 diabetes mellitus without complications: Secondary | ICD-10-CM | POA: Diagnosis not present

## 2021-04-29 DIAGNOSIS — R21 Rash and other nonspecific skin eruption: Secondary | ICD-10-CM | POA: Diagnosis not present

## 2021-04-29 DIAGNOSIS — D696 Thrombocytopenia, unspecified: Secondary | ICD-10-CM | POA: Diagnosis not present

## 2021-04-29 DIAGNOSIS — Z5112 Encounter for antineoplastic immunotherapy: Secondary | ICD-10-CM | POA: Diagnosis present

## 2021-04-29 DIAGNOSIS — T451X5D Adverse effect of antineoplastic and immunosuppressive drugs, subsequent encounter: Secondary | ICD-10-CM | POA: Diagnosis not present

## 2021-04-29 LAB — CBC WITH DIFFERENTIAL (CANCER CENTER ONLY)
Abs Immature Granulocytes: 0.01 10*3/uL (ref 0.00–0.07)
Basophils Absolute: 0.1 10*3/uL (ref 0.0–0.1)
Basophils Relative: 1 %
Eosinophils Absolute: 0.2 10*3/uL (ref 0.0–0.5)
Eosinophils Relative: 5 %
HCT: 48.4 % (ref 39.0–52.0)
Hemoglobin: 15.8 g/dL (ref 13.0–17.0)
Immature Granulocytes: 0 %
Lymphocytes Relative: 28 %
Lymphs Abs: 1.4 10*3/uL (ref 0.7–4.0)
MCH: 27.8 pg (ref 26.0–34.0)
MCHC: 32.6 g/dL (ref 30.0–36.0)
MCV: 85.1 fL (ref 80.0–100.0)
Monocytes Absolute: 0.3 10*3/uL (ref 0.1–1.0)
Monocytes Relative: 7 %
Neutro Abs: 2.9 10*3/uL (ref 1.7–7.7)
Neutrophils Relative %: 59 %
Platelet Count: 77 10*3/uL — ABNORMAL LOW (ref 150–400)
RBC: 5.69 MIL/uL (ref 4.22–5.81)
RDW: 16.2 % — ABNORMAL HIGH (ref 11.5–15.5)
WBC Count: 4.9 10*3/uL (ref 4.0–10.5)
nRBC: 0 % (ref 0.0–0.2)

## 2021-04-29 LAB — URINALYSIS, COMPLETE (UACMP) WITH MICROSCOPIC
Bilirubin Urine: NEGATIVE
Glucose, UA: NEGATIVE mg/dL
Hgb urine dipstick: NEGATIVE
Ketones, ur: NEGATIVE mg/dL
Leukocytes,Ua: NEGATIVE
Nitrite: NEGATIVE
Protein, ur: 100 mg/dL — AB
Specific Gravity, Urine: 1.021 (ref 1.005–1.030)
pH: 5 (ref 5.0–8.0)

## 2021-04-29 LAB — CMP (CANCER CENTER ONLY)
ALT: 25 U/L (ref 0–44)
AST: 22 U/L (ref 15–41)
Albumin: 4 g/dL (ref 3.5–5.0)
Alkaline Phosphatase: 102 U/L (ref 38–126)
Anion gap: 9 (ref 5–15)
BUN: 17 mg/dL (ref 6–20)
CO2: 25 mmol/L (ref 22–32)
Calcium: 10.1 mg/dL (ref 8.9–10.3)
Chloride: 103 mmol/L (ref 98–111)
Creatinine: 1.28 mg/dL — ABNORMAL HIGH (ref 0.61–1.24)
GFR, Estimated: >60 mL/min (ref >60)
Glucose, Bld: 106 mg/dL — ABNORMAL HIGH (ref 70–99)
Potassium: 4.5 mmol/L (ref 3.5–5.1)
Sodium: 137 mmol/L (ref 135–145)
Total Bilirubin: 0.7 mg/dL (ref 0.3–1.2)
Total Protein: 7.1 g/dL (ref 6.5–8.1)

## 2021-04-29 LAB — PROTIME-INR
INR: 1 (ref 0.8–1.2)
Prothrombin Time: 13.1 seconds (ref 11.4–15.2)

## 2021-04-29 LAB — PHOSPHORUS: Phosphorus: 4 mg/dL (ref 2.5–4.6)

## 2021-04-29 LAB — MAGNESIUM: Magnesium: 1.5 mg/dL — ABNORMAL LOW (ref 1.7–2.4)

## 2021-04-29 LAB — CK: Total CK: 48 U/L — ABNORMAL LOW (ref 49–397)

## 2021-04-29 LAB — RESEARCH LABS

## 2021-04-29 LAB — T4, FREE: Free T4: 0.96 ng/dL (ref 0.61–1.12)

## 2021-04-29 LAB — TSH: TSH: 2.526 u[IU]/mL (ref 0.320–4.118)

## 2021-04-29 LAB — BILIRUBIN, DIRECT: Bilirubin, Direct: 0.1 mg/dL (ref 0.0–0.2)

## 2021-04-29 MED ORDER — SODIUM CHLORIDE 0.9% FLUSH
10.0000 mL | INTRAVENOUS | Status: DC | PRN
  Administered 2021-04-29: 10 mL

## 2021-04-29 MED ORDER — MAGNESIUM SULFATE 2 GM/50ML IV SOLN: 2.0000 g | Freq: Once | INTRAVENOUS | Status: DC

## 2021-04-29 MED ORDER — SODIUM CHLORIDE 0.9 % IV SOLN
6.0000 mg/kg | Freq: Once | INTRAVENOUS | Status: AC
  Administered 2021-04-29: 630 mg via INTRAVENOUS
  Filled 2021-04-29: qty 31.5

## 2021-04-29 MED ORDER — ALTEPLASE 2 MG IJ SOLR
2.0000 mg | Freq: Once | INTRAMUSCULAR | Status: AC
  Administered 2021-04-29: 2 mg

## 2021-04-29 MED ORDER — HEPARIN SOD (PORK) LOCK FLUSH 100 UNIT/ML IV SOLN
500.0000 [IU] | Freq: Once | INTRAVENOUS | Status: AC | PRN
  Administered 2021-04-29: 500 [IU]

## 2021-04-29 MED ORDER — MAGNESIUM SULFATE 2 GM/50ML IV SOLN
2.0000 g | Freq: Once | INTRAVENOUS | Status: AC
  Administered 2021-04-29: 2 g via INTRAVENOUS
  Filled 2021-04-29: qty 50

## 2021-04-29 MED ORDER — INV-SOTORASIB 120 MG TAB AMGEN 20190172: 960.0000 mg | ORAL_TABLET | Freq: Every day | ORAL | 0 refills | Status: DC

## 2021-04-29 MED ORDER — SODIUM CHLORIDE 0.9 % IV SOLN: INTRAVENOUS | Status: DC

## 2021-04-29 NOTE — Progress Notes (Signed)
TRIAL: [place] / "A Phase 3 Multicenter, Randomized, Openlabel, Active-controlled Study of Sotorasib and Panitumumab Versus Investigator's Choice (Trifluridine and Tipiracil, or Regorafenib) for the Treatment of Previously Treated Metastatic Colorectal Cancer Subjects with KRAS p.G12C Mutation."  Cycle 2/Day 1 Patient presented alone to [facility] for morning lab appointment for the start of cycle 2, day 1. I met with patient prior to lab draw. Patient reports to be doing well and denies having any new issues since his last visit with research. Pre-treatment labs drawn as per protocol. Patient was presented with study provided ipad for completion of cycle 2 questionnaires. Patient reports his last meal was at dinner last night, and states he hasn't had breakfast this morning. Patient was reminded to be at [facility] for his 0940 AM appointment with Dr. Benay Spice.   Labs: [phone] Pre treatment labs, including research specimen collection, completed per study protocol. PRO's: cycle 2 day 1 questionnaires completed on study provided electronic ipad device, after lab draw.   MD Appointment at [place]:  Patient arrived to [facility] for MD appointment scheduled for 0940 AM.  Vital signs: collected and documented, as required per protocol.  ECG: Collected after patient had a calm five minute rest in supine position. MD reviewed result, initialed and dated report.  H&P: MD assessed patient and reviewed any adverse events and labs. After MD's review of labs and patient assessment, patient was cleared to start cycle 2 day 1 this morning. Per MD, patient with slight improvement to panitumumab rash. Patient with Gr1 hypomagnesemia today and per MD, patient to receive magnesium infusion. Other all other labs deemed non-clinically significant by MD. Adverse Events/SAE's: Patient with no SAE's. Patient denies any nausea/vomiting or diarrhea. Patient denies pain or having any new or worsening adverse events.  Patient with stable and slight improvement papulopustual rash. Platelet count has decreased, Gr 1.  Research: Required end of infusion panitumumab PK drawn via PIV, @ 1:40 PM. ECOG: MD assessed, ECOG score of 1. Treatment: After patient was cleared to start treatment, Patient started on IV magnesium, followed with panitumumab, Lot # [id number]. Patient had lunch @ noon, during his infusion treatment.  Concomitant medications: patient denies any changes/updates to his medication list. Sotorasib:  Patient returned one sotorasib study dispensed bottle with 16 tablets remaining, count verified by [facility] pharmacist, Acey Lav. patient was provided two (2) new bottles of study drug, Sotorasib 120 mg each tab, RX 2595-0, Lot # [id number], expires 03/31/02023, dispensed by [facility]. Patient instructed to continue with 8 pills for a total of 960 mg daily and states he takes them with his dinner.  PK specimen draws: Pre-treatment drawn in morning with other required cycle 2 labs, before any study treatment was given,  at 0836. EOI PK for panitumumab drawn @ 1:40 PM Plan: Patient knows to continue with daily sotorasib and documentation. I spoke with patient about changing his schedule to have all study labs drawn on Mondays, with study treatment to follow on Tuesdays, which should help to shorten his day in clinic, patient does continue to work full time. Patient agrees to this schedule change. Patient informed next clinic visit date for cycle 2 day 15 to be scheduled for November 28th and 29th. Patient denies further questions today and was thanked for his time and continued support of study and was encouraged to call with questions or concerns.    MARQUAY KRUSE [id number]  04/15/2021  Adverse Event Log CTCAE v5.0  Study/Protocol: AMGEN Codebreak Cycle: Cycle 1/Day 15 - Start  of Cycle 2/Day 1 H/O: HTN-Gr2; Proteinuria-Gr1; Glucosuria- Gr1; hyponatremia- Gr1; Creatinine, elevated- Gr1; ALT, elevated- Gr1; ALK  Phosphatase, elevated-Gr2; direct bilirubin, elevated- Gr1; platelet, decreased- Gr1; Hypomagnesemia- Gr 1; Diabetes mellitus, type II; hypoalbuminemia, Gr2; total bilirubin, elevated- Gr1;   Event Grade Onset Date Resolved Date Related to Sotorasib Related to Panitumumab  Treatment Comments  Papulopustular rash Gr 2 04/09/21 ongoing no yes OTC hydrocortisone  Doxycycline started 04/01/21            hypomagnesemia Gr 1  04/29/21 ongoing no yes IV magnesium Mag resulted today @ 1.5     [name], RN, BSN, [place] Clinical Research Nurse Lead 04/01/2021 3:29 PM

## 2021-04-29 NOTE — Progress Notes (Signed)
NO blooed return from [place] a cath. Flushes well. Cathflo in place. Pt sent to lab for bloood draw

## 2021-04-29 NOTE — Progress Notes (Signed)
/  Patient presents for treatment. RN assessment completed along with the following:  Labs/vitals reviewed - Yes, and ok to treat with platelets 77    Weight within 10% of previous measurement - Yes Oncology Treatment Attestation completed for current therapy- Yes, on date 04/01/21 Informed consent completed and reflects current therapy/intent - Yes, on date 04/01/21             Provider progress note reviewed - Today's provider note is not yet available. I reviewed the most recent oncology provider progress note in chart dated 04/15/21. Treatment/Antibody/Supportive plan reviewed - Yes, and there are no adjustments needed for today's treatment. S&H and other orders reviewed - Magnesium ordered Previous treatment date reviewed - Yes, and the appropriate amount of time has elapsed between treatments. Clinic Hand Off Received from - none  Patient to proceed with treatment.

## 2021-04-29 NOTE — Progress Notes (Signed)
  [facility] OFFICE PROGRESS NOTE   Diagnosis: Colon cancer  INTERVAL HISTORY:   Dan Bell returns as scheduled.  He completed another treatment with panitumumab on 04/15/2021.  He continues sotorasib.  He reports some drying of the rash over the head and trunk.  No diarrhea.  He is working.  No other complaint.  Objective:  Vital signs in last 24 hours:  Blood pressure (!) 135/98, pulse 61, temperature 97.8 F (36.6 C), temperature source Oral, resp. rate 18, height $RemoveBe'5\' 10"'bOcdeWLHH$  (1.778 m), weight 231 lb 12.8 oz (105.1 kg), SpO2 98 %.    HEENT: No thrush or ulcers Resp: Lungs clear bilaterally Cardio: Regular rate and rhythm GI: No hepatosplenomegaly, nontender, no mass Vascular: No leg edema  Skin: Acne type rash over the face/scalp and trunk  Portacath/PICC-without erythema  Lab Results:  Lab Results  Component Value Date   WBC 4.9 04/29/2021   HGB 15.8 04/29/2021   HCT 48.4 04/29/2021   MCV 85.1 04/29/2021   PLT 77 (L) 04/29/2021   NEUTROABS 2.9 04/29/2021    CMP  Lab Results  Component Value Date   NA 137 04/29/2021   K 4.5 04/29/2021   CL 103 04/29/2021   CO2 25 04/29/2021   GLUCOSE 106 (H) 04/29/2021   BUN 17 04/29/2021   CREATININE 1.28 (H) 04/29/2021   CALCIUM 10.1 04/29/2021   PROT 7.1 04/29/2021   ALBUMIN 4.0 04/29/2021   AST 22 04/29/2021   ALT 25 04/29/2021   ALKPHOS 102 04/29/2021   BILITOT 0.7 04/29/2021   GFRNONAA >60 04/29/2021    Lab Results  Component Value Date   CEA1 722.88 (H) 04/01/2021    Medications: I have reviewed the patient's current medications.   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, mismatch repair proficient Colonoscopy 07/27/2019-sigmoid colon mass, biopsy-well differentiated adenocarcinoma CTs chest, abdomen, pelvis 07/28/2019-numerous liver metastases, mass in the distal sigmoid colon, necrotic appearing node in the sigmoid mesentery, enlarged porta hepatis and  portacaval nodes Markedly elevated CEA 08/01/2019 Biopsy of liver lesion 08/23/2019-metastatic mucinous adenocarcinoma, CK20 and CDX2 positive FOLFOX plus bevacizumab, cycle 1 08/24/2019, 11 total cycles, oxaliplatin held with cycle 11 secondary to a reaction with cycle 10 FOLFOX plus bevacizumab discontinued after cycle 19 secondary to disease progression FOLFIRI plus bevacizumab beginning 06/20/2020, status post 12 cycles Disease progression on CT 02/11/2021-enlarging lung nodules and liver lesions CEA increased 02/13/2021 CTs 03/11/2021-bulky hypodense liver metastases, slightly progressive, enlargement of bilateral lung nodules, slight enlargement of mediastinal nodes, splenomegaly and gastroesophageal varices Sotorasib/panitumumab on the Codebreak study beginning 04/01/2021    Type 2 diabetes Left lower extremity DVT 2018 Oxaliplatin reaction with cycle 10 Skin rash most likely related to Panitumumab, grade 2 04/15/2021 Thrombocytopenia    Disposition: Dan Bell appears stable.  He appears to be tolerating the sotorasib well.  He has a panitumumab skin rash.  The rash appears partially improved today.  He will continue doxycycline.  He has hypomagnesemia secondary to panitumumab.  We will replete the magnesium today.  He will complete another treatment with panitumumab today.   Dan Bell has thrombocytopenia secondary to the extensive course of systemic therapy and splenomegaly.  The platelets are adequate to continue treatment today.  He will return for an office visit and panitumumab in 2 weeks.  His ECOG performance status is measured at 1 today. Betsy Coder, MD  04/29/2021  1:33 PM

## 2021-04-29 NOTE — Patient Instructions (Signed)
[facility]  Discharge Instructions: Thank you for choosing [facility] to provide your oncology and hematology care.   If you have a lab appointment with the [facility], please go directly to the [facility] and check in at the registration area.   Wear comfortable clothing and clothing appropriate for easy access to any Portacath or PICC line.   We strive to give you quality time with your provider. You may need to reschedule your appointment if you arrive late (15 or more minutes).  Arriving late affects you and other patients whose appointments are after yours.  Also, if you miss three or more appointments without notifying the office, you may be dismissed from the clinic at the provider's discretion.      For prescription refill requests, have your pharmacy contact our office and allow 72 hours for refills to be completed.    Today you received the following chemotherapy and/or immunotherapy agents INV-Panitumumab-[employer]      To help prevent nausea and vomiting after your treatment, we encourage you to take your nausea medication as directed.  BELOW ARE SYMPTOMS THAT SHOULD BE REPORTED IMMEDIATELY: *FEVER GREATER THAN 100.4 F (38 C) OR HIGHER *CHILLS OR SWEATING *NAUSEA AND VOMITING THAT IS NOT CONTROLLED WITH YOUR NAUSEA MEDICATION *UNUSUAL SHORTNESS OF BREATH *UNUSUAL BRUISING OR BLEEDING *URINARY PROBLEMS (pain or burning when urinating, or frequent urination) *BOWEL PROBLEMS (unusual diarrhea, constipation, pain near the anus) TENDERNESS IN MOUTH AND THROAT WITH OR WITHOUT PRESENCE OF ULCERS (sore throat, sores in mouth, or a toothache) UNUSUAL RASH, SWELLING OR PAIN  UNUSUAL VAGINAL DISCHARGE OR ITCHING   Items with * indicate a potential emergency and should be followed up as soon as possible or go to the Emergency Department if any problems should occur.  Please show the CHEMOTHERAPY ALERT CARD or IMMUNOTHERAPY ALERT CARD at  check-in to the Emergency Department and triage nurse.  Should you have questions after your visit or need to cancel or reschedule your appointment, please contact [facility]  Dept: [phone]  and follow the prompts.  Office hours are 8:00 a.m. to 4:30 p.m. Monday - Friday. Please note that voicemails left after 4:00 p.m. may not be returned until the following business day.  We are closed weekends and major holidays. You have access to a nurse at all times for urgent questions. Please call the main number to the clinic Dept: [phone] and follow the prompts.   For any non-urgent questions, you may also contact your provider using MyChart. We now offer e-Visits for anyone 59 and older to request care online for non-urgent symptoms. For details visit [email or web address].   Also download the MyChart app! Go to the app store, search "MyChart", open the app, select [facility], and log in with your MyChart username and password.  Due to Covid, a mask is required upon entering the hospital/clinic. If you do not have a mask, one will be given to you upon arrival. For doctor visits, patients may have 1 support person aged 67 or older with them. For treatment visits, patients cannot have anyone with them due to current Covid guidelines and our immunocompromised population.   Panitumumab Solution for Injection What is this medication? PANITUMUMAB (pan i TOOM ue mab) is a monoclonal antibody. It is used to treat colorectal cancer. This medicine may be used for other purposes; ask your health care provider or pharmacist if you have questions. COMMON BRAND NAME(S): Vectibix What  should I tell my care team before I take this medication? They need to know if you have any of these conditions: eye disease, vision problems low levels of calcium, magnesium, or potassium in the blood lung or breathing disease, like asthma skin conditions or sensitivity an unusual or allergic  reaction to panitumumab, other medicines, foods, dyes, or preservatives pregnant or trying to get pregnant breast-feeding How should I use this medication? This drug is given as an infusion into a vein. It is administered in a hospital or clinic by a specially trained health care professional. Talk to your pediatrician regarding the use of this medicine in children. Special care may be needed. Overdosage: If you think you have taken too much of this medicine contact a poison control center or emergency room at once. NOTE: This medicine is only for you. Do not share this medicine with others. What if I miss a dose? It is important not to miss your dose. Call your doctor or health care professional if you are unable to keep an appointment. What may interact with this medication? Do not take this medicine with any of the following medications: bevacizumab This list may not describe all possible interactions. Give your health care provider a list of all the medicines, herbs, non-prescription drugs, or dietary supplements you use. Also tell them if you smoke, drink alcohol, or use illegal drugs. Some items may interact with your medicine. What should I watch for while using this medication? Visit your doctor for checks on your progress. This drug may make you feel generally unwell. This is not uncommon, as chemotherapy can affect healthy cells as well as cancer cells. Report any side effects. Continue your course of treatment even though you feel ill unless your doctor tells you to stop. This medicine can make you more sensitive to the sun. Keep out of the sun while receiving this medicine and for 2 months after the last dose. If you cannot avoid being in the sun, wear protective clothing and use sunscreen. Do not use sun lamps or tanning beds/booths. In some cases, you may be given additional medicines to help with side effects. Follow all directions for their use. Call your doctor or health care  professional for advice if you get a fever, chills or sore throat, or other symptoms of a cold or flu. Do not treat yourself. This drug decreases your body's ability to fight infections. Try to avoid being around people who are sick. Avoid taking products that contain aspirin, acetaminophen, ibuprofen, naproxen, or ketoprofen unless instructed by your doctor. These medicines may hide a fever. Do not become pregnant while taking this medicine and for 2 months after the last dose. Women should inform their doctor if they wish to become pregnant or think they might be pregnant. There is a potential for serious side effects to an unborn child. Talk to your health care professional or pharmacist for more information. Do not breast-feed an infant while taking this medicine or for 2 months after the last dose. What side effects may I notice from receiving this medication? Side effects that you should report to your doctor or health care professional as soon as possible: allergic reactions like skin rash, itching or hives, swelling of the face, lips, or tongue breathing problems changes in vision eye pain fast, irregular heartbeat fever, chills mouth sores red spots on the skin redness, blistering, peeling or loosening of the skin, including inside the mouth signs and symptoms of kidney injury like trouble passing  urine or change in the amount of urine signs and symptoms of low blood pressure like dizziness; feeling faint or lightheaded, falls; unusually weak or tired signs of low calcium like fast heartbeat, muscle cramps or muscle pain; pain, tingling, numbness in the hands or feet; seizures signs and symptoms of low magnesium like muscle cramps, pain, or weakness; tremors; seizures; or fast, irregular heartbeat signs and symptoms of low potassium like muscle cramps or muscle pain; chest pain; dizziness; feeling faint or lightheaded, falls; palpitations; breathing problems; or fast, irregular  heartbeat swelling of the ankles, feet, hands Side effects that usually do not require medical attention (report to your doctor or health care professional if they continue or are bothersome): changes in skin like acne, cracks, skin dryness diarrhea eyelash growth headache mouth sores nail changes nausea, vomiting This list may not describe all possible side effects. Call your doctor for medical advice about side effects. You may report side effects to FDA at 1-800-FDA-1088. Where should I keep my medication? This drug is given in a hospital or clinic and will not be stored at home. NOTE: This sheet is a summary. It may not cover all possible information. If you have questions about this medicine, talk to your doctor, pharmacist, or health care provider.  2022 Elsevier/Gold Standard (2015-12-28 00:00:00)

## 2021-04-30 ENCOUNTER — Other Ambulatory Visit: Payer: Self-pay | Admitting: Medical Oncology

## 2021-04-30 ENCOUNTER — Encounter: Payer: Self-pay | Admitting: Oncology

## 2021-04-30 LAB — T3, FREE: T3, Free: 3 pg/mL (ref 2.0–4.4)

## 2021-04-30 NOTE — Progress Notes (Deleted)
Patient completed this with 1st cycle. New study drug sotorasib, 2 bottles, dispensed with cycle 2.

## 2021-05-01 LAB — PTT FACTOR INHIBITOR (MIXING STUDY): aPTT: 23.9 s (ref 22.9–30.2)

## 2021-05-03 ENCOUNTER — Other Ambulatory Visit: Payer: Self-pay | Admitting: Medical Oncology

## 2021-05-03 DIAGNOSIS — C187 Malignant neoplasm of sigmoid colon: Secondary | ICD-10-CM

## 2021-05-06 NOTE — Progress Notes (Signed)
Addendum created to reflect correct panitumumab EOI PK blood draw: time drawn was at 12:40.  [name], RN, BSN, [facility] Clinical Research Nurse Lead 05/06/2021 11:58 AM

## 2021-05-07 ENCOUNTER — Other Ambulatory Visit: Payer: Self-pay | Admitting: Medical Oncology

## 2021-05-07 DIAGNOSIS — C187 Malignant neoplasm of sigmoid colon: Secondary | ICD-10-CM

## 2021-05-08 ENCOUNTER — Telehealth: Payer: Self-pay | Admitting: Medical Oncology

## 2021-05-08 NOTE — Telephone Encounter (Signed)
TRIAL: [facility] / "A Phase 3 Multicenter, Randomized, Openlabel, Active-controlled Study of Sotorasib and Panitumumab Versus Investigator's Choice (Trifluridine and Tipiracil, or Regorafenib) for the Treatment of Previously Treated Metastatic Colorectal Cancer Subjects with KRAS p.G12C Mutation."  Outgoing call:  Monday and Tuesday appointments confirmed with patient for Cycle 2 Day 15 of study. Patient knows that only his labs will be drawn Monday morning (11/28), via port (he will not go home with port) and his appointment with Dr. Benay Spice is Tuesday morning (11/29) with infusion to follow. Patient was concerned about having to go home with port in place for Tuesday morning. I informed him that he does not have to go home with port and that it can be reaccessed Tuesday in infusion. Patient reports to be doing well, he does state that he has a "bit" of a rash again, but not like the first time. Patient denies other issues or concerns at this time. Patient thanked and encouraged to call with questions.  [name], RN, BSN, [facility] Clinical Research Nurse Lead 05/08/2021 2:16 PM

## 2021-05-12 ENCOUNTER — Other Ambulatory Visit: Payer: Self-pay | Admitting: Oncology

## 2021-05-13 ENCOUNTER — Inpatient Hospital Stay: Payer: Managed Care, Other (non HMO)

## 2021-05-13 ENCOUNTER — Other Ambulatory Visit: Payer: Self-pay

## 2021-05-13 ENCOUNTER — Inpatient Hospital Stay: Payer: Managed Care, Other (non HMO) | Admitting: Oncology

## 2021-05-13 DIAGNOSIS — C187 Malignant neoplasm of sigmoid colon: Secondary | ICD-10-CM

## 2021-05-13 LAB — MAGNESIUM: Magnesium: 1.5 mg/dL — ABNORMAL LOW (ref 1.7–2.4)

## 2021-05-13 LAB — CBC WITH DIFFERENTIAL (CANCER CENTER ONLY)
Abs Immature Granulocytes: 0.02 10*3/uL (ref 0.00–0.07)
Basophils Absolute: 0.1 10*3/uL (ref 0.0–0.1)
Basophils Relative: 1 %
Eosinophils Absolute: 0.2 10*3/uL (ref 0.0–0.5)
Eosinophils Relative: 5 %
HCT: 47 % (ref 39.0–52.0)
Hemoglobin: 15.4 g/dL (ref 13.0–17.0)
Immature Granulocytes: 0 %
Lymphocytes Relative: 25 %
Lymphs Abs: 1.2 10*3/uL (ref 0.7–4.0)
MCH: 28 pg (ref 26.0–34.0)
MCHC: 32.8 g/dL (ref 30.0–36.0)
MCV: 85.5 fL (ref 80.0–100.0)
Monocytes Absolute: 0.5 10*3/uL (ref 0.1–1.0)
Monocytes Relative: 10 %
Neutro Abs: 2.9 10*3/uL (ref 1.7–7.7)
Neutrophils Relative %: 59 %
Platelet Count: 91 10*3/uL — ABNORMAL LOW (ref 150–400)
RBC: 5.5 MIL/uL (ref 4.22–5.81)
RDW: 15.9 % — ABNORMAL HIGH (ref 11.5–15.5)
WBC Count: 4.9 10*3/uL (ref 4.0–10.5)
nRBC: 0 % (ref 0.0–0.2)

## 2021-05-13 LAB — CMP (CANCER CENTER ONLY)
ALT: 31 U/L (ref 0–44)
AST: 23 U/L (ref 15–41)
Albumin: 3.8 g/dL (ref 3.5–5.0)
Alkaline Phosphatase: 121 U/L (ref 38–126)
Anion gap: 9 (ref 5–15)
BUN: 18 mg/dL (ref 6–20)
CO2: 24 mmol/L (ref 22–32)
Calcium: 9.3 mg/dL (ref 8.9–10.3)
Chloride: 106 mmol/L (ref 98–111)
Creatinine: 1.43 mg/dL — ABNORMAL HIGH (ref 0.61–1.24)
GFR, Estimated: >60 mL/min (ref >60)
Glucose, Bld: 164 mg/dL — ABNORMAL HIGH (ref 70–99)
Potassium: 4.8 mmol/L (ref 3.5–5.1)
Sodium: 139 mmol/L (ref 135–145)
Total Bilirubin: 0.7 mg/dL (ref 0.3–1.2)
Total Protein: 6.7 g/dL (ref 6.5–8.1)

## 2021-05-13 LAB — CK: Total CK: 35 U/L — ABNORMAL LOW (ref 49–397)

## 2021-05-13 LAB — BILIRUBIN, DIRECT: Bilirubin, Direct: 0.2 mg/dL (ref 0.0–0.2)

## 2021-05-13 LAB — PHOSPHORUS: Phosphorus: 3 mg/dL (ref 2.5–4.6)

## 2021-05-13 MED ORDER — HEPARIN SOD (PORK) LOCK FLUSH 100 UNIT/ML IV SOLN: 500.0000 [IU] | Freq: Once | INTRAVENOUS | Status: DC

## 2021-05-13 MED ORDER — SODIUM CHLORIDE 0.9% FLUSH: 10.0000 mL | Freq: Once | INTRAVENOUS | Status: DC

## 2021-05-14 ENCOUNTER — Inpatient Hospital Stay: Payer: Managed Care, Other (non HMO)

## 2021-05-14 ENCOUNTER — Encounter: Payer: Self-pay | Admitting: Medical Oncology

## 2021-05-14 ENCOUNTER — Inpatient Hospital Stay (HOSPITAL_BASED_OUTPATIENT_CLINIC_OR_DEPARTMENT_OTHER): Payer: Managed Care, Other (non HMO) | Admitting: Oncology

## 2021-05-14 VITALS — BP 123/90 | HR 73 | Temp 98.0°F | Resp 18 | Ht 70.0 in | Wt 232.6 lb

## 2021-05-14 DIAGNOSIS — Z006 Encounter for examination for normal comparison and control in clinical research program: Secondary | ICD-10-CM

## 2021-05-14 DIAGNOSIS — C187 Malignant neoplasm of sigmoid colon: Secondary | ICD-10-CM

## 2021-05-14 DIAGNOSIS — Z9189 Other specified personal risk factors, not elsewhere classified: Secondary | ICD-10-CM

## 2021-05-14 DIAGNOSIS — Z5112 Encounter for antineoplastic immunotherapy: Secondary | ICD-10-CM | POA: Diagnosis not present

## 2021-05-14 DIAGNOSIS — Z23 Encounter for immunization: Secondary | ICD-10-CM

## 2021-05-14 DIAGNOSIS — Z95828 Presence of other vascular implants and grafts: Secondary | ICD-10-CM

## 2021-05-14 MED ORDER — MAGNESIUM SULFATE 2 GM/50ML IV SOLN
2.0000 g | Freq: Once | INTRAVENOUS | Status: AC
  Administered 2021-05-14: 2 g via INTRAVENOUS

## 2021-05-14 MED ORDER — INFLUENZA VAC SPLIT QUAD 0.5 ML IM SUSY
0.5000 mL | PREFILLED_SYRINGE | Freq: Once | INTRAMUSCULAR | Status: AC
  Administered 2021-05-14: 0.5 mL via INTRAMUSCULAR

## 2021-05-14 MED ORDER — SODIUM CHLORIDE 0.9% FLUSH
10.0000 mL | Freq: Once | INTRAVENOUS | Status: AC
  Administered 2021-05-14: 10 mL via INTRAVENOUS

## 2021-05-14 MED ORDER — SODIUM CHLORIDE 0.9 % IV SOLN
6.0000 mg/kg | Freq: Once | INTRAVENOUS | Status: AC
  Administered 2021-05-14: 630 mg via INTRAVENOUS
  Filled 2021-05-14: qty 31.5

## 2021-05-14 MED ORDER — SODIUM CHLORIDE 0.9 % IV SOLN: Freq: Once | INTRAVENOUS | Status: AC

## 2021-05-14 MED ORDER — HEPARIN SOD (PORK) LOCK FLUSH 100 UNIT/ML IV SOLN
500.0000 [IU] | Freq: Once | INTRAVENOUS | Status: AC
  Administered 2021-05-14: 500 [IU] via INTRAVENOUS

## 2021-05-14 NOTE — Progress Notes (Signed)
Patient presents for treatment. RN assessment completed along with the following:  Labs/vitals reviewed - Yes, and Per Dr. Benay Spice, ok to treat with platelets 91, Mg 1.5. Pt to receive IV Mg replacement during infusion visit.     Weight within 10% of previous measurement - Yes Oncology Treatment Attestation completed for current therapy- Yes, on date 04/01/21 Informed consent completed and reflects current therapy/intent - Yes, on date 04/01/21             Provider progress note reviewed - Yes, today's provider note was reviewed. Treatment/Antibody/Supportive plan reviewed - Yes, and there are no adjustments needed for today's treatment. S&H and other orders reviewed - Yes, and patient to receive IV mg replacement and flu vaccine today.  Previous treatment date reviewed - Yes, and the appropriate amount of time has elapsed between treatments. Clinic Hand Off Received from - Adele Dan, RN, Dr. Benay Spice  Patient to proceed with treatment.

## 2021-05-14 NOTE — Progress Notes (Signed)
TRIAL: [place]. / "A Phase 3 Multicenter, Randomized, Openlabel, Active-controlled Study of Sotorasib and Panitumumab Versus Investigator's Choice (Trifluridine and Tipiracil, or Regorafenib) for the Treatment of Previously Treated Metastatic Colorectal Cancer Subjects with KRAS p.G12C Mutation."  Cycle 2/Day 15: labs @ [facility] 11/28 Patient presented alone to [facility] for morning lab appointment for the start of cycle 2, day 15.  Pre-treatment labs drawn per protocol.    Cycle 2/Day 15 MD appt + Treatment at [facility] Patient presents to clinic alone this morning, I met with patient after his vitals were assessed. Patient reports to be doing well. Patient denies having any issues or concerns. Patient denies nausea, vomiting, bowel issues, or pain. Labs were drawn yesterday and resulted with Grade 1 hypomagnesemia again and per MD, patient to receive 2 gm of IV magnesium, prior to treatment with panitumumab. Patient reports to have been fasting prior to lab draw. Patient reports to continue to work full time.  Vital signs: collected and documented, as required per protocol.  H&P: MD assessed patient and reviewed any adverse events and labs. After MD's review of labs and patient assessment, patient was cleared to start cycle 2 day 15 this morning. Per MD, patient with stable rash. Other all other labs deemed non-clinically significant by MD. Adverse Events/SAE's: Patient with no SAE's. Patient denies any nausea/vomiting or diarrhea. Patient denies pain or having any new or worsening adverse events. Patient with stable papulopustual rash. Platelet count improved but continues at Gr 1.  Research: no research labs required at this cycle 2 day 15. ECOG: MD assessed, ECOG score of 1. Treatment: After patient was cleared to start treatment, Patient was started on IV magnesium, to be followed with panitumumab, Lot # [id number].  Concomitant medications: patient denies any changes/updates to his  medication list. confirms to be using the hydrocortisone on rash areas and daily oral doxycycline. Patient requested to have the flu shot today, and this was given to him today prior to treatment by infusion nurse.  Sotorasib: No dispensing of sotorasib at this cycle 2 day 15. Patient confirms to be taking his study drug sotorasib as instructed and confirms to take it in the evening with dinner without issue. Patient reports most recent dose taken was last night (11/28) at approximately 6 PM. Patient states last dose taken before lab draw was Sunday, 11/27th, with dinner. He states that he had a late snack at approximately 9 PM Sunday evening, states no breakfast in the morning before labs on the 28th, and reports to have had breakfast after his study required labs were drawn on Monday 05/13/21.  PK specimen draws: Per protocol, not required at this visit.  Plan: Patient knows to continue with daily sotorasib and documentation. Patient informed of CT imaging scan scheduled for December 6th at 2:30 PM, patient instructed to arrive at 2:15. Patient was provided with 2 bottles of contrast and was instructed to not eat anything four hours prior to scan appointment, to drink first contrast bottle two hours prior and second contrast bottle one hour prior to his scan appointment, patient gave his verbal understanding. Next clinic visit date for cycle 3 day 1 to be scheduled for December 12th for labs and 13th for MD and treatment. Patient denies further questions today and was thanked for his time and continued support of study and was encouraged to call Dr. Benay Spice or myself with questions or concerns.    Dan Bell [id number]  05/14/2021  Adverse Event Log CTCAE v5.0  Study/Protocol: [employer] Codebreak Cycle: Cycle 2/Day 1 - Start of Cycle 2/Day 15 H/O: HTN-Gr2; Proteinuria-Gr1; Glucosuria- Gr1; hyponatremia- Gr1; Creatinine, elevated- Gr1; ALT, elevated- Gr1; ALK Phosphatase, elevated-Gr2; direct  bilirubin, elevated- Gr1; platelet, decreased- Gr1; Hypomagnesemia- Gr 1; Diabetes mellitus, type II; hypoalbuminemia, Gr2; total bilirubin, elevated- Gr1;   Event Grade Onset Date Resolved Date Related to Sotorasib Related to Panitumumab  Treatment Comments  Papulopustular rash Gr 2 04/09/21 ongoing no yes OTC hydrocortisone  Doxycycline started 04/01/21            hypomagnesemia Gr 1  04/29/21 ongoing no yes IV magnesium Mag resulted today @ 1.5 (04/29/21) Mag @ 1.5 on 05/14/21     Dan Marion, RN, BSN, [facility] Clinical Research Nurse Lead 04/01/2021 10:30 AM

## 2021-05-14 NOTE — Patient Instructions (Signed)
[facility]  Discharge Instructions: Thank you for choosing [facility] to provide your oncology and hematology care.   If you have a lab appointment with the [facility], please go directly to the [facility] and check in at the registration area.   Wear comfortable clothing and clothing appropriate for easy access to any Portacath or PICC line.   We strive to give you quality time with your provider. You may need to reschedule your appointment if you arrive late (15 or more minutes).  Arriving late affects you and other patients whose appointments are after yours.  Also, if you miss three or more appointments without notifying the office, you may be dismissed from the clinic at the provider's discretion.      For prescription refill requests, have your pharmacy contact our office and allow 72 hours for refills to be completed.    Today you received the following chemotherapy and/or immunotherapy agents INV-Panitumumab-[employer]      To help prevent nausea and vomiting after your treatment, we encourage you to take your nausea medication as directed.  BELOW ARE SYMPTOMS THAT SHOULD BE REPORTED IMMEDIATELY: *FEVER GREATER THAN 100.4 F (38 C) OR HIGHER *CHILLS OR SWEATING *NAUSEA AND VOMITING THAT IS NOT CONTROLLED WITH YOUR NAUSEA MEDICATION *UNUSUAL SHORTNESS OF BREATH *UNUSUAL BRUISING OR BLEEDING *URINARY PROBLEMS (pain or burning when urinating, or frequent urination) *BOWEL PROBLEMS (unusual diarrhea, constipation, pain near the anus) TENDERNESS IN MOUTH AND THROAT WITH OR WITHOUT PRESENCE OF ULCERS (sore throat, sores in mouth, or a toothache) UNUSUAL RASH, SWELLING OR PAIN  UNUSUAL VAGINAL DISCHARGE OR ITCHING   Items with * indicate a potential emergency and should be followed up as soon as possible or go to the Emergency Department if any problems should occur.  Please show the CHEMOTHERAPY ALERT CARD or IMMUNOTHERAPY ALERT CARD at  check-in to the Emergency Department and triage nurse.  Should you have questions after your visit or need to cancel or reschedule your appointment, please contact [facility]  Dept: [phone]  and follow the prompts.  Office hours are 8:00 a.m. to 4:30 p.m. Monday - Friday. Please note that voicemails left after 4:00 p.m. may not be returned until the following business day.  We are closed weekends and major holidays. You have access to a nurse at all times for urgent questions. Please call the main number to the clinic Dept: [phone] and follow the prompts.   For any non-urgent questions, you may also contact your provider using MyChart. We now offer e-Visits for anyone 59 and older to request care online for non-urgent symptoms. For details visit [email or web address].   Also download the MyChart app! Go to the app store, search "MyChart", open the app, select [facility], and log in with your MyChart username and password.  Due to Covid, a mask is required upon entering the hospital/clinic. If you do not have a mask, one will be given to you upon arrival. For doctor visits, patients may have 1 support person aged 67 or older with them. For treatment visits, patients cannot have anyone with them due to current Covid guidelines and our immunocompromised population.   Panitumumab Solution for Injection What is this medication? PANITUMUMAB (pan i TOOM ue mab) is a monoclonal antibody. It is used to treat colorectal cancer. This medicine may be used for other purposes; ask your health care provider or pharmacist if you have questions. COMMON BRAND NAME(S): Vectibix What  should I tell my care team before I take this medication? They need to know if you have any of these conditions: eye disease, vision problems low levels of calcium, magnesium, or potassium in the blood lung or breathing disease, like asthma skin conditions or sensitivity an unusual or allergic  reaction to panitumumab, other medicines, foods, dyes, or preservatives pregnant or trying to get pregnant breast-feeding How should I use this medication? This drug is given as an infusion into a vein. It is administered in a hospital or clinic by a specially trained health care professional. Talk to your pediatrician regarding the use of this medicine in children. Special care may be needed. Overdosage: If you think you have taken too much of this medicine contact a poison control center or emergency room at once. NOTE: This medicine is only for you. Do not share this medicine with others. What if I miss a dose? It is important not to miss your dose. Call your doctor or health care professional if you are unable to keep an appointment. What may interact with this medication? Do not take this medicine with any of the following medications: bevacizumab This list may not describe all possible interactions. Give your health care provider a list of all the medicines, herbs, non-prescription drugs, or dietary supplements you use. Also tell them if you smoke, drink alcohol, or use illegal drugs. Some items may interact with your medicine. What should I watch for while using this medication? Visit your doctor for checks on your progress. This drug may make you feel generally unwell. This is not uncommon, as chemotherapy can affect healthy cells as well as cancer cells. Report any side effects. Continue your course of treatment even though you feel ill unless your doctor tells you to stop. This medicine can make you more sensitive to the sun. Keep out of the sun while receiving this medicine and for 2 months after the last dose. If you cannot avoid being in the sun, wear protective clothing and use sunscreen. Do not use sun lamps or tanning beds/booths. In some cases, you may be given additional medicines to help with side effects. Follow all directions for their use. Call your doctor or health care  professional for advice if you get a fever, chills or sore throat, or other symptoms of a cold or flu. Do not treat yourself. This drug decreases your body's ability to fight infections. Try to avoid being around people who are sick. Avoid taking products that contain aspirin, acetaminophen, ibuprofen, naproxen, or ketoprofen unless instructed by your doctor. These medicines may hide a fever. Do not become pregnant while taking this medicine and for 2 months after the last dose. Women should inform their doctor if they wish to become pregnant or think they might be pregnant. There is a potential for serious side effects to an unborn child. Talk to your health care professional or pharmacist for more information. Do not breast-feed an infant while taking this medicine or for 2 months after the last dose. What side effects may I notice from receiving this medication? Side effects that you should report to your doctor or health care professional as soon as possible: allergic reactions like skin rash, itching or hives, swelling of the face, lips, or tongue breathing problems changes in vision eye pain fast, irregular heartbeat fever, chills mouth sores red spots on the skin redness, blistering, peeling or loosening of the skin, including inside the mouth signs and symptoms of kidney injury like trouble passing  urine or change in the amount of urine signs and symptoms of low blood pressure like dizziness; feeling faint or lightheaded, falls; unusually weak or tired signs of low calcium like fast heartbeat, muscle cramps or muscle pain; pain, tingling, numbness in the hands or feet; seizures signs and symptoms of low magnesium like muscle cramps, pain, or weakness; tremors; seizures; or fast, irregular heartbeat signs and symptoms of low potassium like muscle cramps or muscle pain; chest pain; dizziness; feeling faint or lightheaded, falls; palpitations; breathing problems; or fast, irregular  heartbeat swelling of the ankles, feet, hands Side effects that usually do not require medical attention (report to your doctor or health care professional if they continue or are bothersome): changes in skin like acne, cracks, skin dryness diarrhea eyelash growth headache mouth sores nail changes nausea, vomiting This list may not describe all possible side effects. Call your doctor for medical advice about side effects. You may report side effects to FDA at 1-800-FDA-1088. Where should I keep my medication? This drug is given in a hospital or clinic and will not be stored at home. NOTE: This sheet is a summary. It may not cover all possible information. If you have questions about this medicine, talk to your doctor, pharmacist, or health care provider.  2022 Elsevier/Gold Standard (2015-12-28 00:00:00)

## 2021-05-14 NOTE — Progress Notes (Signed)
  [facility] OFFICE PROGRESS NOTE   Diagnosis: Colon cancer  INTERVAL HISTORY:   [name] continues sotorasib.  He was last treated with panitumumab on 04/29/2021.  No diarrhea.  Stable rash over the face/scalp and trunk.  He continues doxycycline.  He is working.  No other complaint.  Objective:  Vital signs in last 24 hours:  Blood pressure 123/90, pulse 73, temperature 98 F (36.7 C), temperature source Oral, resp. rate 18, height $RemoveBe'5\' 10"'oqAMnExwy$  (1.778 m), weight 232 lb 9.6 oz (105.5 kg), SpO2 100 %.    HEENT: No thrush or ulcers Resp: Lungs clear bilaterally Cardio: Regular rate and rhythm GI: Nontender, no hepatosplenomegaly Vascular: No leg edema  Skin: Acne type rash over the face, trunk, and extremities  Portacath/PICC-without erythema  Lab Results:  Lab Results  Component Value Date   WBC 4.9 05/13/2021   HGB 15.4 05/13/2021   HCT 47.0 05/13/2021   MCV 85.5 05/13/2021   PLT 91 (L) 05/13/2021   NEUTROABS 2.9 05/13/2021    CMP  Lab Results  Component Value Date   NA 139 05/13/2021   K 4.8 05/13/2021   CL 106 05/13/2021   CO2 24 05/13/2021   GLUCOSE 164 (H) 05/13/2021   BUN 18 05/13/2021   CREATININE 1.43 (H) 05/13/2021   CALCIUM 9.3 05/13/2021   PROT 6.7 05/13/2021   ALBUMIN 3.8 05/13/2021   AST 23 05/13/2021   ALT 31 05/13/2021   ALKPHOS 121 05/13/2021   BILITOT 0.7 05/13/2021   GFRNONAA >60 05/13/2021    Lab Results  Component Value Date   CEA1 722.88 (H) 04/01/2021     Medications: I have reviewed the patient's current medications.   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, mismatch repair proficient Colonoscopy 07/27/2019-sigmoid colon mass, biopsy-well differentiated adenocarcinoma CTs chest, abdomen, pelvis 07/28/2019-numerous liver metastases, mass in the distal sigmoid colon, necrotic appearing node in the sigmoid mesentery, enlarged porta hepatis and portacaval nodes Markedly elevated CEA  08/01/2019 Biopsy of liver lesion 08/23/2019-metastatic mucinous adenocarcinoma, CK20 and CDX2 positive FOLFOX plus bevacizumab, cycle 1 08/24/2019, 11 total cycles, oxaliplatin held with cycle 11 secondary to a reaction with cycle 10 FOLFOX plus bevacizumab discontinued after cycle 19 secondary to disease progression FOLFIRI plus bevacizumab beginning 06/20/2020, status post 12 cycles Disease progression on CT 02/11/2021-enlarging lung nodules and liver lesions CEA increased 02/13/2021 CTs 03/11/2021-bulky hypodense liver metastases, slightly progressive, enlargement of bilateral lung nodules, slight enlargement of mediastinal nodes, splenomegaly and gastroesophageal varices Sotorasib/panitumumab on the Codebreak study beginning 04/01/2021    Type 2 diabetes Left lower extremity DVT 2018 Oxaliplatin reaction with cycle 10 Skin rash most likely related to Panitumumab, grade 2 04/15/2021 Thrombocytopenia      Disposition: Dan Bell appears stable.  He is tolerating the sotorasib well.  He has a stable panitumumab skin rash.  He will continue doxycycline.  He will complete day 15 of cycle 2 today.  He will undergo restaging CTs after this cycle.  [name] has an ECOG performance status of 1 today.  [name] will receive an influenza vaccine today.  He will return for an office visit on 05/27/2021.  Betsy Coder, MD  05/14/2021  8:17 AM

## 2021-05-20 ENCOUNTER — Telehealth: Payer: Self-pay

## 2021-05-20 NOTE — Telephone Encounter (Signed)
Research nurse called patient and left voice mail for him to return call at his earliest convenience. Explained that I was trying to reach him for Rubin Payor, RN in the research department to remind him of his scans scheduled tomorrow and to ensure he had no questions regarding the contrast instructions.  Research will attempt to contact patient again this afternoon. Jeral Fruit, RN 05/20/21 10:31 AM  Research nurse was able to speak with the patient this afternoon. He states he is fine with all of his instructions for his contrast and scan in the morning. He states after 2 years he's got the hang of it now. Encouraged patient to call if he has any questions or concerns.  Jeral Fruit, RN 05/20/21 4:31 PM

## 2021-05-21 ENCOUNTER — Other Ambulatory Visit: Payer: Self-pay

## 2021-05-21 ENCOUNTER — Ambulatory Visit (HOSPITAL_COMMUNITY)
Admission: RE | Admit: 2021-05-21 | Discharge: 2021-05-21 | Disposition: A | Discharge status: Home / Self Care | Payer: Self-pay | Source: Ambulatory Visit | Attending: Oncology | Admitting: Oncology

## 2021-05-21 DIAGNOSIS — C187 Malignant neoplasm of sigmoid colon: Secondary | ICD-10-CM | POA: Insufficient documentation

## 2021-05-21 MED ORDER — IOHEXOL 350 MG/ML SOLN
100.0000 mL | Freq: Once | INTRAVENOUS | Status: AC | PRN
  Administered 2021-05-21: 100 mL via INTRAVENOUS

## 2021-05-21 MED ORDER — SODIUM CHLORIDE (PF) 0.9 % IJ SOLN
INTRAMUSCULAR | Status: AC
  Filled 2021-05-21: qty 50

## 2021-05-24 ENCOUNTER — Other Ambulatory Visit: Payer: Self-pay | Admitting: Medical Oncology

## 2021-05-24 DIAGNOSIS — C187 Malignant neoplasm of sigmoid colon: Secondary | ICD-10-CM

## 2021-05-24 NOTE — Progress Notes (Signed)
TC to Pt left voice mail to return call

## 2021-05-25 ENCOUNTER — Other Ambulatory Visit: Payer: Self-pay | Admitting: Oncology

## 2021-05-27 ENCOUNTER — Inpatient Hospital Stay: Payer: Managed Care, Other (non HMO) | Attending: Oncology

## 2021-05-27 ENCOUNTER — Inpatient Hospital Stay: Payer: Managed Care, Other (non HMO)

## 2021-05-27 ENCOUNTER — Other Ambulatory Visit: Payer: Self-pay

## 2021-05-27 ENCOUNTER — Encounter: Payer: Self-pay | Admitting: Oncology

## 2021-05-27 ENCOUNTER — Inpatient Hospital Stay: Payer: Managed Care, Other (non HMO) | Admitting: Oncology

## 2021-05-27 DIAGNOSIS — Z006 Encounter for examination for normal comparison and control in clinical research program: Secondary | ICD-10-CM | POA: Diagnosis present

## 2021-05-27 DIAGNOSIS — C187 Malignant neoplasm of sigmoid colon: Secondary | ICD-10-CM | POA: Insufficient documentation

## 2021-05-27 LAB — CMP (CANCER CENTER ONLY)
ALT: 29 U/L (ref 0–44)
AST: 23 U/L (ref 15–41)
Albumin: 3.7 g/dL (ref 3.5–5.0)
Alkaline Phosphatase: 127 U/L — ABNORMAL HIGH (ref 38–126)
Anion gap: 10 (ref 5–15)
BUN: 17 mg/dL (ref 6–20)
CO2: 22 mmol/L (ref 22–32)
Calcium: 9.2 mg/dL (ref 8.9–10.3)
Chloride: 105 mmol/L (ref 98–111)
Creatinine: 1.23 mg/dL (ref 0.61–1.24)
GFR, Estimated: >60 mL/min (ref >60)
Glucose, Bld: 142 mg/dL — ABNORMAL HIGH (ref 70–99)
Potassium: 4.7 mmol/L (ref 3.5–5.1)
Sodium: 137 mmol/L (ref 135–145)
Total Bilirubin: 0.8 mg/dL (ref 0.3–1.2)
Total Protein: 6.7 g/dL (ref 6.5–8.1)

## 2021-05-27 LAB — URINALYSIS, COMPLETE (UACMP) WITH MICROSCOPIC
Bilirubin Urine: NEGATIVE
Glucose, UA: 50 mg/dL — AB
Hgb urine dipstick: NEGATIVE
Ketones, ur: NEGATIVE mg/dL
Leukocytes,Ua: NEGATIVE
Nitrite: NEGATIVE
Protein, ur: 100 mg/dL — AB
Specific Gravity, Urine: 1.019 (ref 1.005–1.030)
pH: 5 (ref 5.0–8.0)

## 2021-05-27 LAB — CBC WITH DIFFERENTIAL (CANCER CENTER ONLY)
Abs Immature Granulocytes: 0.05 10*3/uL (ref 0.00–0.07)
Basophils Absolute: 0.1 10*3/uL (ref 0.0–0.1)
Basophils Relative: 2 %
Eosinophils Absolute: 0.2 10*3/uL (ref 0.0–0.5)
Eosinophils Relative: 5 %
HCT: 46.5 % (ref 39.0–52.0)
Hemoglobin: 15.4 g/dL (ref 13.0–17.0)
Immature Granulocytes: 1 %
Lymphocytes Relative: 27 %
Lymphs Abs: 1.2 10*3/uL (ref 0.7–4.0)
MCH: 27.8 pg (ref 26.0–34.0)
MCHC: 33.1 g/dL (ref 30.0–36.0)
MCV: 83.9 fL (ref 80.0–100.0)
Monocytes Absolute: 0.4 10*3/uL (ref 0.1–1.0)
Monocytes Relative: 10 %
Neutro Abs: 2.4 10*3/uL (ref 1.7–7.7)
Neutrophils Relative %: 55 %
Platelet Count: 92 10*3/uL — ABNORMAL LOW (ref 150–400)
RBC: 5.54 MIL/uL (ref 4.22–5.81)
RDW: 15.3 % (ref 11.5–15.5)
WBC Count: 4.3 10*3/uL (ref 4.0–10.5)
nRBC: 0 % (ref 0.0–0.2)

## 2021-05-27 LAB — BILIRUBIN, DIRECT: Bilirubin, Direct: 0.2 mg/dL (ref 0.0–0.2)

## 2021-05-27 LAB — LIPID PANEL
Cholesterol: 169 mg/dL (ref 0–200)
HDL: 37 mg/dL — ABNORMAL LOW (ref >40)
LDL Cholesterol: 109 mg/dL — ABNORMAL HIGH (ref 0–99)
Total CHOL/HDL Ratio: 4.6 RATIO
Triglycerides: 117 mg/dL (ref <150)
VLDL: 23 mg/dL (ref 0–40)

## 2021-05-27 LAB — RESEARCH LABS

## 2021-05-27 LAB — CK: Total CK: 45 U/L — ABNORMAL LOW (ref 49–397)

## 2021-05-27 LAB — PROTIME-INR
INR: 0.9 (ref 0.8–1.2)
Prothrombin Time: 12.3 seconds (ref 11.4–15.2)

## 2021-05-27 LAB — PHOSPHORUS: Phosphorus: 3.3 mg/dL (ref 2.5–4.6)

## 2021-05-27 LAB — MAGNESIUM: Magnesium: 1.5 mg/dL — ABNORMAL LOW (ref 1.7–2.4)

## 2021-05-27 LAB — T4, FREE: Free T4: 1.11 ng/dL (ref 0.61–1.12)

## 2021-05-27 LAB — CEA (IN HOUSE-CHCC): CEA (CHCC-In House): 23.48 ng/mL — ABNORMAL HIGH (ref 0.00–5.00)

## 2021-05-27 NOTE — Progress Notes (Signed)
05/27/21 - [employer] CodeBreak study. - Patient into the cancer center this morning for his labs & research labs to be drawn.  He completed the PRO's.  He stated he would bring in his pill bottle when he comes in tomorrow for his visit with Dr. Benay Spice and chemo.  The patient was thanked for his support in this study. [name] 05/27/21 - 8:00 am

## 2021-05-28 ENCOUNTER — Telehealth: Payer: Self-pay

## 2021-05-28 ENCOUNTER — Inpatient Hospital Stay: Payer: Managed Care, Other (non HMO)

## 2021-05-28 ENCOUNTER — Inpatient Hospital Stay (HOSPITAL_BASED_OUTPATIENT_CLINIC_OR_DEPARTMENT_OTHER): Payer: Managed Care, Other (non HMO) | Admitting: Oncology

## 2021-05-28 ENCOUNTER — Encounter: Payer: Self-pay | Admitting: Medical Oncology

## 2021-05-28 VITALS — BP 134/93 | HR 84 | Temp 98.7°F | Resp 18 | Ht 70.0 in | Wt 235.4 lb

## 2021-05-28 DIAGNOSIS — Z006 Encounter for examination for normal comparison and control in clinical research program: Secondary | ICD-10-CM

## 2021-05-28 DIAGNOSIS — C187 Malignant neoplasm of sigmoid colon: Secondary | ICD-10-CM

## 2021-05-28 LAB — THYROID PANEL WITH TSH
Free Thyroxine Index: 2.1 (ref 1.2–4.9)
T3 Uptake Ratio: 27 % (ref 24–39)
T4, Total: 7.8 ug/dL (ref 4.5–12.0)
TSH: 3.46 u[IU]/mL (ref 0.450–4.500)

## 2021-05-28 LAB — T3, FREE: T3, Free: 3.9 pg/mL (ref 2.0–4.4)

## 2021-05-28 MED ORDER — SODIUM CHLORIDE 0.9 % IV SOLN
6.0000 mg/kg | Freq: Once | INTRAVENOUS | Status: AC
  Administered 2021-05-28: 630 mg via INTRAVENOUS
  Filled 2021-05-28 (×2): qty 31.5

## 2021-05-28 MED ORDER — MAGNESIUM SULFATE 2 GM/50ML IV SOLN
2.0000 g | Freq: Once | INTRAVENOUS | Status: AC
  Administered 2021-05-28: 2 g via INTRAVENOUS
  Filled 2021-05-28: qty 50

## 2021-05-28 MED ORDER — HEPARIN SOD (PORK) LOCK FLUSH 100 UNIT/ML IV SOLN
500.0000 [IU] | Freq: Once | INTRAVENOUS | Status: AC | PRN
  Administered 2021-05-28: 500 [IU]

## 2021-05-28 MED ORDER — SODIUM CHLORIDE 0.9 % IV SOLN: Freq: Once | INTRAVENOUS | Status: AC

## 2021-05-28 MED ORDER — INV-SOTORASIB 120 MG TAB AMGEN 20190172: 960.0000 mg | ORAL_TABLET | Freq: Every day | ORAL | 0 refills | Status: DC

## 2021-05-28 MED ORDER — SODIUM CHLORIDE 0.9% FLUSH
10.0000 mL | INTRAVENOUS | Status: DC | PRN
  Administered 2021-05-28: 10 mL

## 2021-05-28 NOTE — Progress Notes (Signed)
[facility] OFFICE PROGRESS NOTE   Bell: Colon cancer  INTERVAL HISTORY:   Dan Bell returns as scheduled.  He continues sotorasib.  He was last treated with panitumumab on 05/14/2021.  No mouth sores, nausea, or diarrhea.  No pain.  Good appetite.  He is working.  Stable rash over the face and trunk.  He feels well.  No new complaint.  Objective:  Vital signs in last 24 hours:  Blood pressure (!) 134/93, pulse 84, temperature 98.7 F (37.1 C), temperature source Oral, resp. rate 18, height 5' 10" (1.778 m), weight 235 lb 6.4 oz (106.8 kg), SpO2 100 %.    HEENT: No thrush or ulcers Resp: Lungs clear bilaterally Cardio: Regular rate and rhythm GI: No hepatosplenomegaly, nontender Vascular: No leg edema  Skin: Acne type rash over the face, chest, and back, palms without erythema, no paronychia  Portacath/PICC-without erythema  Lab Results:  Lab Results  Component Value Date   WBC 4.3 05/27/2021   HGB 15.4 05/27/2021   HCT 46.5 05/27/2021   MCV 83.9 05/27/2021   PLT 92 (L) 05/27/2021   NEUTROABS 2.4 05/27/2021    CMP  Lab Results  Component Value Date   NA 137 05/27/2021   K 4.7 05/27/2021   CL 105 05/27/2021   CO2 22 05/27/2021   GLUCOSE 142 (H) 05/27/2021   BUN 17 05/27/2021   CREATININE 1.23 05/27/2021   CALCIUM 9.2 05/27/2021   PROT 6.7 05/27/2021   ALBUMIN 3.7 05/27/2021   AST 23 05/27/2021   ALT 29 05/27/2021   ALKPHOS 127 (H) 05/27/2021   BILITOT 0.8 05/27/2021   GFRNONAA >60 05/27/2021    Lab Results  Component Value Date   CEA1 23.48 (H) 05/27/2021    Lab Results  Component Value Date   INR 0.9 05/27/2021   LABPROT 12.3 05/27/2021    Imaging:  No results found.  Medications: I have reviewed the patient's current medications.   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, mismatch repair proficient Colonoscopy 07/27/2019-sigmoid colon mass, biopsy-well differentiated  adenocarcinoma CTs chest, abdomen, pelvis 07/28/2019-numerous liver metastases, mass in the distal sigmoid colon, necrotic appearing node in the sigmoid mesentery, enlarged porta hepatis and portacaval nodes Markedly elevated CEA 08/01/2019 Biopsy of liver lesion 08/23/2019-metastatic mucinous adenocarcinoma, CK20 and CDX2 positive FOLFOX plus bevacizumab, cycle 1 08/24/2019, 11 total cycles, oxaliplatin held with cycle 11 secondary to a reaction with cycle 10 FOLFOX plus bevacizumab discontinued after cycle 19 secondary to disease progression FOLFIRI plus bevacizumab beginning 06/20/2020, status post 12 cycles Disease progression on CT 02/11/2021-enlarging lung nodules and liver lesions CEA increased 02/13/2021 CTs 03/11/2021-bulky hypodense liver metastases, slightly progressive, enlargement of bilateral lung nodules, slight enlargement of mediastinal nodes, splenomegaly and gastroesophageal varices Sotorasib/panitumumab on the Codebreak study beginning 04/01/2021 CTs 05/21/2021-decrease size of lung and liver metastases, no new lesions    Type 2 diabetes Left lower extremity DVT 2018 Oxaliplatin reaction with cycle 10 Skin rash most likely related to Panitumumab, grade 2 04/15/2021 Thrombocytopenia Hypomagnesemia secondary to panitumumab    Disposition: Dan Bell appears stable.  He continues to tolerate the sotorasib and panitumumab well.  The CEA is lower.  The restaging CTs are consistent with a response to therapy.  He will continue the current treatment.  His ECOG performance status is measured at 1 today.  Dan Bell will complete another treatment with panitumumab today.  He will receive magnesium supplementation today.  He will return for an office visit in  2 weeks.  I reviewed the CT images with Dan Bell.  Betsy Coder, MD  05/28/2021  9:06 AM

## 2021-05-28 NOTE — Progress Notes (Signed)
TRIAL: [place] / A Phase 3 Multicenter, Randomized, Openlabel, Active-controlled Study of Sotorasib and Panitumumab Versus Investigators Choice (Trifluridine and Tipiracil, or Regorafenib) for the Treatment of Previously Treated Metastatic Colorectal Cancer Subjects with KRAS p.G12C Mutation.  Cycle 3/Day 1: labs @ [facility] 05/27/21 Patient presented alone to [facility] for morning lab appointment for the start of cycle 3, day 1. Research specialist, [name], met with patient prior to lab draw and presented patient with study provided ipad for completion of cycle 3 questionnaires.  PK specimen draws: Pre-treatment drawn in morning with other required cycle 3 labs, before any study treatment was given.   05/28/2021: MD Appointment at [facility]:  Patient arrived to [facility] for MD appointment. I met with patient in exam room.Patient reports to be doing well and denies having any new issues or problems since his last visit. Patient denies any issues or difficulties with self administration and documentation of sotorasib. Pre-treatment labs drawn per protocol yesterday and have resulted for MD review. Patient reports his last meal, before lab drawn was approximately around 8 PM Sunday evening on 12/11, and states he hadn't had breakfast before his labs, but did right after. Patient reports his most recent meal was around 0830 this morning.   Vital signs: collected and documented, as required per protocol.  H&P: MD assessed patient and reviewed any adverse events and labs. MD also reviewed recent CT scan of chest, abdomen and pelvis, with patient today. After MD's review of labs and patient assessment, MD cleared patient to start cycle 3 day 1 this morning. Patient continues with Gr1 hypomagnesemia and per MD, to receive magnesium infusion again. Other all other labs deemed non-clinically significant by MD. Adverse Events/SAE's: Patient with no SAE's. Patient denies any nausea/vomiting or  diarrhea. Patient denies pain or having any new or worsening adverse events. Patient continues with rash, which is stable, and hypomagnesemia. Platelet count slightly improved and continues at a Gr 1.  Research: Sotorasib dispense Patient returned two sotorasib study dispensed bottles that were dispensed at Cycle 2 day 1, one empty and one with 8 tablets remaining, count verified by pharmacist, Roselind Messier, and both bottles were provided to Manuela Schwartz to return to [facility] for verification of return. Patient was provided two (2) new bottles of study drug, Sotorasib 120 mg each tab, RX 2646-0, Lot # [id number], expires 08/2021, dispensed by [facility] with 120 tablets in each bottle. Patient instructed to continue with self-administration and documentation of 8 pills for a total of 960 mg daily and he verbally confirmed understanding. ECOG: MD assessed, ECOG score of 1. Treatment: After patient was cleared to start treatment, Patient was started on 2 gram of IV magnesium, followed with panitumumab, RX # [id number] Lot # [id number].  Concomitant medications: patient denies any changes/updates to his medication list. Plan: Patient gave verbal understanding to continue with daily sotorasib and documentation of self administration in the study electronic medication diary.  Patient denies further questions today and was thanked for his time and continued support of study. I encouraged him to call Dr. Benay Spice or myself with questions or concerns. Day 15, Cycle 3 is scheduled for labs, MD and treatment on 06/11/21.Marland Kitchen    ARMONTE TORTORELLA [id number]  05/28/2021  Adverse Event Log CTCAE v5.0  Study/Protocol: [facility] Codebreak Cycle: Cycle 2/Day 15 to start of Cycle 3/Day 1 H/O: HTN-Gr2; Proteinuria-Gr1; Glucosuria- Gr1; hyponatremia- Gr1; Creatinine, elevated- Gr1; ALT, elevated- Gr1; ALK Phosphatase, elevated-Gr2; direct bilirubin, elevated- Gr1; platelet, decreased- Gr1; Hypomagnesemia- Gr 1;  Diabetes mellitus, type II; hypoalbuminemia,  Gr2; total bilirubin, elevated- Gr1;   Event Grade Onset Date Resolved Date Related to Sotorasib Related to Panitumumab  Treatment Comments  Papulopustular rash Gr 2 04/09/21 ongoing no yes OTC hydrocortisone  Doxycycline started 04/01/21            hypomagnesemia Gr 1  04/29/21 ongoing no yes IV magnesium Mag resulted today @ 1.5 Mag 05/14/21 @ 1.5 Mag 05/27/21 @ 1.5     [name], RN, BSN, [place] Clinical Research Nurse Lead 04/01/2021 10:45 AM

## 2021-05-28 NOTE — Telephone Encounter (Signed)
Patient seen by Dr. Sherrill today ? ?Vitals are within treatment parameters. ? ?Labs reviewed by Dr. Sherrill and are within treatment parameters. ? ?Per physician team, patient is ready for treatment and there are NO modifications to the treatment plan.  ?

## 2021-05-28 NOTE — Patient Instructions (Signed)
[facility]   Discharge Instructions: Thank you for choosing [facility] to provide your oncology and hematology care.   If you have a lab appointment with the [facility], please go directly to the [facility] and check in at the registration area.   Wear comfortable clothing and clothing appropriate for easy access to any Portacath or PICC line.   We strive to give you quality time with your provider. You may need to reschedule your appointment if you arrive late (15 or more minutes).  Arriving late affects you and other patients whose appointments are after yours.  Also, if you miss three or more appointments without notifying the office, you may be dismissed from the clinic at the providers discretion.      For prescription refill requests, have your pharmacy contact our office and allow 72 hours for refills to be completed.    Today you received the following chemotherapy and/or immunotherapy agents INV- Panitumumab      To help prevent nausea and vomiting after your treatment, we encourage you to take your nausea medication as directed.  BELOW ARE SYMPTOMS THAT SHOULD BE REPORTED IMMEDIATELY: *FEVER GREATER THAN 100.4 F (38 C) OR HIGHER *CHILLS OR SWEATING *NAUSEA AND VOMITING THAT IS NOT CONTROLLED WITH YOUR NAUSEA MEDICATION *UNUSUAL SHORTNESS OF BREATH *UNUSUAL BRUISING OR BLEEDING *URINARY PROBLEMS (pain or burning when urinating, or frequent urination) *BOWEL PROBLEMS (unusual diarrhea, constipation, pain near the anus) TENDERNESS IN MOUTH AND THROAT WITH OR WITHOUT PRESENCE OF ULCERS (sore throat, sores in mouth, or a toothache) UNUSUAL RASH, SWELLING OR PAIN  UNUSUAL VAGINAL DISCHARGE OR ITCHING   Items with * indicate a potential emergency and should be followed up as soon as possible or go to the Emergency Department if any problems should occur.  Please show the CHEMOTHERAPY ALERT CARD or IMMUNOTHERAPY ALERT CARD at  check-in to the Emergency Department and triage nurse.  Should you have questions after your visit or need to cancel or reschedule your appointment, please contact [facility]  Dept: [phone]  and follow the prompts.  Office hours are 8:00 a.m. to 4:30 p.m. Monday - Friday. Please note that voicemails left after 4:00 p.m. may not be returned until the following business day.  We are closed weekends and major holidays. You have access to a nurse at all times for urgent questions. Please call the main number to the clinic Dept: [phone] and follow the prompts.   For any non-urgent questions, you may also contact your provider using MyChart. We now offer e-Visits for anyone 47 and older to request care online for non-urgent symptoms. For details visit [email or web address].   Also download the MyChart app! Go to the app store, search "MyChart", open the app, select [facility], and log in with your MyChart username and password.  Due to Covid, a mask is required upon entering the hospital/clinic. If you do not have a mask, one will be given to you upon arrival. For doctor visits, patients may have 1 support person aged 73 or older with them. For treatment visits, patients cannot have anyone with them due to current Covid guidelines and our immunocompromised population.

## 2021-05-28 NOTE — Progress Notes (Signed)
Patient presents for treatment. RN assessment completed along with the following:  Labs/vitals reviewed - Yes, and within treatment parameters.Mag 1.5. Give magnesium   Weight within 10% of previous measurement - Yes Oncology Treatment Attestation completed for current therapy- Yes, on date 04/01/21 Informed consent completed and reflects current therapy/intent - Yes, on date 04/01/21             Provider progress note reviewed - Yes, today's provider note was reviewed. Treatment/Antibody/Supportive plan reviewed - Yes, and there are no adjustments needed for today's treatment. S&H and other orders reviewed - Yes, and magnesium order in Previous treatment date reviewed - Yes, and the appropriate amount of time has elapsed between treatments. Clinic Hand Off Received from - Lenox Ponds, LPN   Patient to proceed with treatment.

## 2021-05-29 ENCOUNTER — Encounter: Payer: Self-pay | Admitting: Oncology

## 2021-05-29 LAB — PTT FACTOR INHIBITOR (MIXING STUDY)
aPTT 1:1 Normal Plasma: 24 s (ref 22.9–30.2)
aPTT: 24 s (ref 22.9–30.2)

## 2021-06-03 ENCOUNTER — Other Ambulatory Visit: Payer: Self-pay | Admitting: Medical Oncology

## 2021-06-03 DIAGNOSIS — C187 Malignant neoplasm of sigmoid colon: Secondary | ICD-10-CM

## 2021-06-06 ENCOUNTER — Telehealth: Payer: Self-pay | Admitting: Medical Oncology

## 2021-06-06 NOTE — Telephone Encounter (Signed)
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   [facility]OM reminding patient of his scheduled appointments on Tuesday the 27th of December. Informed patient that the cancer center clinics are closed on Monday. Patient has my contact information for questions.  Appointment times provided.  [name], RN, BSN, [facility] Clinical Research Nurse Lead 06/06/2021 12:16 PM

## 2021-06-11 ENCOUNTER — Inpatient Hospital Stay: Payer: Managed Care, Other (non HMO)

## 2021-06-11 ENCOUNTER — Other Ambulatory Visit: Payer: Self-pay

## 2021-06-11 ENCOUNTER — Inpatient Hospital Stay (HOSPITAL_BASED_OUTPATIENT_CLINIC_OR_DEPARTMENT_OTHER): Payer: Managed Care, Other (non HMO) | Admitting: Oncology

## 2021-06-11 ENCOUNTER — Telehealth: Payer: Self-pay

## 2021-06-11 ENCOUNTER — Other Ambulatory Visit: Payer: Managed Care, Other (non HMO)

## 2021-06-11 ENCOUNTER — Encounter: Payer: Self-pay | Admitting: Medical Oncology

## 2021-06-11 VITALS — BP 131/93 | HR 80 | Temp 98.1°F | Resp 20 | Ht 70.0 in | Wt 233.0 lb

## 2021-06-11 DIAGNOSIS — C187 Malignant neoplasm of sigmoid colon: Secondary | ICD-10-CM

## 2021-06-11 DIAGNOSIS — Z006 Encounter for examination for normal comparison and control in clinical research program: Secondary | ICD-10-CM

## 2021-06-11 DIAGNOSIS — Z95828 Presence of other vascular implants and grafts: Secondary | ICD-10-CM

## 2021-06-11 LAB — CBC WITH DIFFERENTIAL (CANCER CENTER ONLY)
Abs Immature Granulocytes: 0.02 10*3/uL (ref 0.00–0.07)
Basophils Absolute: 0.1 10*3/uL (ref 0.0–0.1)
Basophils Relative: 1 %
Eosinophils Absolute: 0.2 10*3/uL (ref 0.0–0.5)
Eosinophils Relative: 4 %
HCT: 42.8 % (ref 39.0–52.0)
Hemoglobin: 14.5 g/dL (ref 13.0–17.0)
Immature Granulocytes: 1 %
Lymphocytes Relative: 25 %
Lymphs Abs: 1.1 10*3/uL (ref 0.7–4.0)
MCH: 28.5 pg (ref 26.0–34.0)
MCHC: 33.9 g/dL (ref 30.0–36.0)
MCV: 84.1 fL (ref 80.0–100.0)
Monocytes Absolute: 0.5 10*3/uL (ref 0.1–1.0)
Monocytes Relative: 11 %
Neutro Abs: 2.6 10*3/uL (ref 1.7–7.7)
Neutrophils Relative %: 58 %
Platelet Count: 91 10*3/uL — ABNORMAL LOW (ref 150–400)
RBC: 5.09 MIL/uL (ref 4.22–5.81)
RDW: 14.8 % (ref 11.5–15.5)
WBC Count: 4.4 10*3/uL (ref 4.0–10.5)
nRBC: 0 % (ref 0.0–0.2)

## 2021-06-11 LAB — CMP (CANCER CENTER ONLY)
ALT: 22 U/L (ref 0–44)
AST: 20 U/L (ref 15–41)
Albumin: 3.8 g/dL (ref 3.5–5.0)
Alkaline Phosphatase: 125 U/L (ref 38–126)
Anion gap: 6 (ref 5–15)
BUN: 15 mg/dL (ref 6–20)
CO2: 26 mmol/L (ref 22–32)
Calcium: 9 mg/dL (ref 8.9–10.3)
Chloride: 105 mmol/L (ref 98–111)
Creatinine: 1.12 mg/dL (ref 0.61–1.24)
GFR, Estimated: >60 mL/min (ref >60)
Glucose, Bld: 172 mg/dL — ABNORMAL HIGH (ref 70–99)
Potassium: 4.3 mmol/L (ref 3.5–5.1)
Sodium: 137 mmol/L (ref 135–145)
Total Bilirubin: 0.9 mg/dL (ref 0.3–1.2)
Total Protein: 6.1 g/dL — ABNORMAL LOW (ref 6.5–8.1)

## 2021-06-11 LAB — MAGNESIUM: Magnesium: 1.4 mg/dL — ABNORMAL LOW (ref 1.7–2.4)

## 2021-06-11 LAB — BILIRUBIN, DIRECT: Bilirubin, Direct: 0.3 mg/dL — ABNORMAL HIGH (ref 0.0–0.2)

## 2021-06-11 LAB — PHOSPHORUS: Phosphorus: 3.6 mg/dL (ref 2.5–4.6)

## 2021-06-11 LAB — CK: Total CK: 57 U/L (ref 49–397)

## 2021-06-11 MED ORDER — HEPARIN SOD (PORK) LOCK FLUSH 100 UNIT/ML IV SOLN
500.0000 [IU] | Freq: Once | INTRAVENOUS | Status: AC | PRN
  Administered 2021-06-11: 12:00:00 500 [IU]

## 2021-06-11 MED ORDER — SODIUM CHLORIDE 0.9% FLUSH
10.0000 mL | INTRAVENOUS | Status: DC | PRN
  Administered 2021-06-11: 08:00:00 10 mL via INTRAVENOUS

## 2021-06-11 MED ORDER — SODIUM CHLORIDE 0.9 % IV SOLN: Freq: Once | INTRAVENOUS | Status: AC

## 2021-06-11 MED ORDER — SODIUM CHLORIDE 0.9 % IV SOLN
6.0000 mg/kg | Freq: Once | INTRAVENOUS | Status: AC
  Administered 2021-06-11: 12:00:00 630 mg via INTRAVENOUS
  Filled 2021-06-11: qty 31.5

## 2021-06-11 MED ORDER — SODIUM CHLORIDE 0.9% FLUSH
10.0000 mL | INTRAVENOUS | Status: DC | PRN
  Administered 2021-06-11: 12:00:00 10 mL

## 2021-06-11 MED ORDER — MAGNESIUM SULFATE 4 GM/100ML IV SOLN
4.0000 g | Freq: Once | INTRAVENOUS | Status: AC
  Administered 2021-06-11: 10:00:00 4 g via INTRAVENOUS
  Filled 2021-06-11: qty 100

## 2021-06-11 NOTE — Progress Notes (Signed)
TRIAL: [place] / A Phase 3 Multicenter, Randomized, Openlabel, Active-controlled Study of Sotorasib and Panitumumab Versus Investigators Choice (Trifluridine and Tipiracil, or Regorafenib) for the Treatment of Previously Treated Metastatic Colorectal Cancer Subjects with KRAS p.G12C Mutation.  Cycle 3/Day 15: 06/11/2021:  labs @ [facility] completed this morning.  Patient presented alone to [facility] for morning lab appointment for the start of cycle 3, day 15. Labs drawn today due to clinic closed yesterday for Christmas Holiday.  06/11/2021: MD Appointment at [facility]:  Patient arrived to [facility] for MD appointment. I met with patient in exam room.Patient reports to be doing well and denies having any new issues or problems since his last visit. Patient continues to work full time and reports to have had a nice Christmas with his family and will be off from work for the rest of this week. Patient denies any issues or difficulties with self administration and documentation of sotorasib. Pre-treatment labs drawn this morning and have resulted for MD review. Patient reports his last meal, before lab drawn was approximately around 9 PM Monday evening on 12/26, and states he hadn't had breakfast before his labs, confirms to have had breakfast after lab drawn this morning around 0815 this morning. Patient confirms last dose of Sotorasib taken with dinner, Monday evening (12/26) at approximately 6 PM.  Vital signs: collected and documented, as required per protocol.  H&P: MD assessed patient and reviewed any adverse events and labs. After MD's review of labs and patient assessment, MD cleared patient to start cycle 3 day 15 this morning. Patient continues with Gr1 hypomagnesemia and per MD, to receive magnesium infusion again. Other all other labs deemed non-clinically significant by MD. MD placed orders for CEA to be collected in two weeks, for Cycle 4/Day 1. Adverse Events/SAE's: Patient with  no SAE's. Patient continues to report to be doing well and denies having any new or worsening issues or concerns. Patient continues with  a stable rash to chest, back and face. Hypomagnesemia at a Grade 1. Platelet count continues at a Gr 1.  Research: Sotorasib dispense None dispensed at this visit.  ECOG: MD assessed, ECOG score of 1. Treatment: After patient was cleared to start treatment, Patient was started on 4 gram of IV magnesium, followed with panitumumab, Lot # [id number], for both vials. [facility] drug ordering site, [facility], is dispensing only one vial. Per [facility] customer service, second vial of panitumumab to be ordered under the "unscheduled visit" tab until the system is fixed (per C3/D1 instructions).  Concomitant medications: patient denies any changes/updates to his medication list. Plan: Patient gave verbal understanding to continue with daily sotorasib and documentation of self administration in the study electronic medication diary.  Patient denies further questions today and was thanked for his time and continued support of study. I encouraged him to call Dr. Benay Spice or myself with questions or concerns. Day 1, Cycle 4 is scheduled for labs 06/24/21, MD and treatment on 06/25/21.  MONTAE STAGER [id number]  05/28/2021  Adverse Event Log CTCAE v5.0  Study/Protocol: [facility] Codebreak Cycle: Cycle 2/Day 15 to start of Cycle 3/Day 1 H/O: HTN-Gr2; Proteinuria-Gr1; Glucosuria- Gr1; hyponatremia- Gr1; Creatinine, elevated- Gr1; ALT, elevated- Gr1; ALK Phosphatase, elevated-Gr2; direct bilirubin, elevated- Gr1; platelet, decreased- Gr1; Hypomagnesemia- Gr 1; Diabetes mellitus, type II; hypoalbuminemia, Gr2; total bilirubin, elevated- Gr1;   Event Grade Onset Date Resolved Date Related to Sotorasib Related to Panitumumab  Treatment Comments  Papulopustular rash Gr 2 04/09/21 ongoing no yes OTC hydrocortisone  Doxycycline started 04/01/21            hypomagnesemia Gr 1  04/29/21  ongoing no yes IV magnesium IV magnesium IV magnesium IV magnesium Mag resulted today @ 1.5 Mag 05/14/21 @ 1.5 Mag 05/27/21 @ 1.5 May 06/11/21 @ 1.4     [name], RN, BSN, [facility] Clinical Research Nurse Lead 04/01/2021 9:49 AM

## 2021-06-11 NOTE — Progress Notes (Signed)
Patient presents for treatment. RN assessment completed along with the following:  Labs/vitals reviewed - Yes, and Per Dr. Benay Spice, ok to treat with Mg 1.4, Platelets 91.     Weight within 10% of previous measurement - Yes Oncology Treatment Attestation completed for current therapy- Yes, on date 04/01/21 Informed consent completed and reflects current therapy/intent - Yes, on date 04/01/21             Provider progress note reviewed - Yes, today's provider note was reviewed. Treatment/Antibody/Supportive plan reviewed - Yes, and patient to receive IV magnesium with treatment today.  S&H and other orders reviewed - Yes, and there are no additional orders identified. Previous treatment date reviewed - Yes, and the appropriate amount of time has elapsed between treatments.  Patient to proceed with treatment.

## 2021-06-11 NOTE — Progress Notes (Signed)
[facility] OFFICE PROGRESS NOTE   Diagnosis: Colon cancer  INTERVAL HISTORY:   Dan Bell returns as scheduled.  He completed another treatment with panitumumab on 05/28/2021.  He continues sotorasib.  No mouth sores or diarrhea.  He reports a stable rash, chiefly over the chest.  He uses moisturizers.  No pain.  No new complaint.  Objective:  Vital signs in last 24 hours:  Blood pressure (!) 131/93, pulse 80, temperature 98.1 F (36.7 C), temperature source Oral, resp. rate 20, height $RemoveBe'5\' 10"'fbPPXOkQD$  (1.778 m), weight 233 lb (105.7 kg), SpO2 100 %.    HEENT: No thrush or ulcers Resp: Lungs clear bilaterally Cardio: Regular rate and rhythm GI: No hepatosplenomegaly Vascular: No leg edema  Skin: Acne type rash over the face and trunk, hands without paronychia  Portacath/PICC-without erythema  Lab Results:  Lab Results  Component Value Date   WBC 4.4 06/11/2021   HGB 14.5 06/11/2021   HCT 42.8 06/11/2021   MCV 84.1 06/11/2021   PLT 91 (L) 06/11/2021   NEUTROABS 2.6 06/11/2021    CMP  Lab Results  Component Value Date   NA 137 06/11/2021   K 4.3 06/11/2021   CL 105 06/11/2021   CO2 26 06/11/2021   GLUCOSE 172 (H) 06/11/2021   BUN 15 06/11/2021   CREATININE 1.12 06/11/2021   CALCIUM 9.0 06/11/2021   PROT 6.1 (L) 06/11/2021   ALBUMIN 3.8 06/11/2021   AST 20 06/11/2021   ALT 22 06/11/2021   ALKPHOS 125 06/11/2021   BILITOT 0.9 06/11/2021   GFRNONAA >60 06/11/2021    Lab Results  Component Value Date   CEA1 23.48 (H) 05/27/2021     Medications: I have reviewed the patient's current medications.   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 and CDX2 positive FOLFOX plus bevacizumab, cycle 1 08/24/2019, 11 total cycles, oxaliplatin held with cycle 11 secondary to a reaction with cycle 10 FOLFOX plus bevacizumab discontinued after cycle 19 secondary to disease progression FOLFIRI plus bevacizumab beginning 06/20/2020, status post 12 cycles Disease progression on CT 02/11/2021-enlarging lung nodules and liver lesions CEA increased 02/13/2021 CTs 03/11/2021-bulky hypodense liver metastases, slightly progressive, enlargement of bilateral lung nodules, slight enlargement of mediastinal nodes, splenomegaly and gastroesophageal varices Sotorasib/panitumumab on the Codebreak study beginning 04/01/2021 CTs 05/21/2021-decrease size of lung and liver metastases, no new lesions    Type 2 diabetes Left lower extremity DVT 2018 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  He will continue treatment with panitumumab and sotorasib.  He is tolerating the treatment well.  His ECOG performance status is measured at 1. He has hypomagnesemia secondary to panitumumab.  He will receive IV magnesium supplementation today.  Dan Bell will return for an office visit and panitumumab in 2 weeks.  Betsy Coder, MD  06/11/2021  9:21 AM

## 2021-06-11 NOTE — Telephone Encounter (Addendum)
Patient seen by Dr. Benay Spice today  Vitals are within treatment parameters.  Labs reviewed by Dr. Benay Spice and are not all within treatment parameters. Magnesium 1.4 Pt to receive 4 g of magnesium today  Platelets 91 Ok to treat per dr.Sherrill  Per physician team, patient is ready for treatment and there are NO modifications to the treatment plan.

## 2021-06-11 NOTE — Patient Instructions (Addendum)
[place]   Discharge Instructions: Thank you for choosing [facility] to provide your oncology and hematology care.   If you have a lab appointment with the [facility], please go directly to the [facility] and check in at the registration area.   Wear comfortable clothing and clothing appropriate for easy access to any Portacath or PICC line.   We strive to give you quality time with your provider. You may need to reschedule your appointment if you arrive late (15 or more minutes).  Arriving late affects you and other patients whose appointments are after yours.  Also, if you miss three or more appointments without notifying the office, you may be dismissed from the clinic at the providers discretion.      For prescription refill requests, have your pharmacy contact our office and allow 72 hours for refills to be completed.    Today you received the following chemotherapy and/or immunotherapy agents INV- Panitumumab      To help prevent nausea and vomiting after your treatment, we encourage you to take your nausea medication as directed.  BELOW ARE SYMPTOMS THAT SHOULD BE REPORTED IMMEDIATELY: *FEVER GREATER THAN 100.4 F (38 C) OR HIGHER *CHILLS OR SWEATING *NAUSEA AND VOMITING THAT IS NOT CONTROLLED WITH YOUR NAUSEA MEDICATION *UNUSUAL SHORTNESS OF BREATH *UNUSUAL BRUISING OR BLEEDING *URINARY PROBLEMS (pain or burning when urinating, or frequent urination) *BOWEL PROBLEMS (unusual diarrhea, constipation, pain near the anus) TENDERNESS IN MOUTH AND THROAT WITH OR WITHOUT PRESENCE OF ULCERS (sore throat, sores in mouth, or a toothache) UNUSUAL RASH, SWELLING OR PAIN  UNUSUAL VAGINAL DISCHARGE OR ITCHING   Items with * indicate a potential emergency and should be followed up as soon as possible or go to the Emergency Department if any problems should occur.  Please show the CHEMOTHERAPY ALERT CARD or IMMUNOTHERAPY ALERT CARD at  check-in to the Emergency Department and triage nurse.  Should you have questions after your visit or need to cancel or reschedule your appointment, please contact [facility]  Dept: [phone]  and follow the prompts.  Office hours are 8:00 a.m. to 4:30 p.m. Monday - Friday. Please note that voicemails left after 4:00 p.m. may not be returned until the following business day.  We are closed weekends and major holidays. You have access to a nurse at all times for urgent questions. Please call the main number to the clinic Dept: [phone] and follow the prompts.   For any non-urgent questions, you may also contact your provider using MyChart. We now offer e-Visits for anyone 3 and older to request care online for non-urgent symptoms. For details visit [email or web address].   Also download the MyChart app! Go to the app store, search "MyChart", open the app, select [facility], and log in with your MyChart username and password.  Due to Covid, a mask is required upon entering the hospital/clinic. If you do not have a mask, one will be given to you upon arrival. For doctor visits, patients may have 1 support person aged 43 or older with them. For treatment visits, patients cannot have anyone with them due to current Covid guidelines and our immunocompromised population.   Magnesium Sulfate Injection What is this medication? MAGNESIUM SULFATE (mag NEE zee um SUL fate) prevents and treats low levels of magnesium in your body. It may also be used to prevent and treat seizures during pregnancy in people with high blood pressure disorders, such as preeclampsia  or eclampsia. Magnesium plays an important role in maintaining the health of your muscles and nervous system. This medicine may be used for other purposes; ask your health care provider or pharmacist if you have questions. What should I tell my care team before I take this medication? They need to know if you have  any of these conditions: Heart disease History of irregular heart beat Kidney disease An unusual or allergic reaction to magnesium sulfate, medications, foods, dyes, or preservatives Pregnant or trying to get pregnant Breast-feeding How should I use this medication? This medication is for infusion into a vein. It is given in a hospital or clinic setting. Talk to your care team about the use of this medication in children. While this medication may be prescribed for selected conditions, precautions do apply. Overdosage: If you think you have taken too much of this medicine contact a poison control center or emergency room at once. NOTE: This medicine is only for you. Do not share this medicine with others. What if I miss a dose? This does not apply. What may interact with this medication? Certain medications for anxiety or sleep Certain medications for seizures like phenobarbital Digoxin Medications that relax muscles for surgery Narcotic medications for pain This list may not describe all possible interactions. Give your health care provider a list of all the medicines, herbs, non-prescription drugs, or dietary supplements you use. Also tell them if you smoke, drink alcohol, or use illegal drugs. Some items may interact with your medicine. What should I watch for while using this medication? Your condition will be monitored carefully while you are receiving this medication. You may need blood work done while you are receiving this medication. What side effects may I notice from receiving this medication? Side effects that you should report to your care team as soon as possible: Allergic reactions--skin rash, itching, hives, swelling of the face, lips, tongue, or throat High magnesium level--confusion, drowsiness, facial flushing, redness, sweating, muscle weakness, fast or irregular heartbeat, trouble breathing Low blood pressure--dizziness, feeling faint or lightheaded, blurry  vision Side effects that usually do not require medical attention (report to your care team if they continue or are bothersome): Headache Nausea This list may not describe all possible side effects. Call your doctor for medical advice about side effects. You may report side effects to FDA at 1-800-FDA-1088. Where should I keep my medication? This medication is given in a hospital or clinic and will not be stored at home. NOTE: This sheet is a summary. It may not cover all possible information. If you have questions about this medicine, talk to your doctor, pharmacist, or health care provider.  2022 Elsevier/Gold Standard (2020-08-16 00:00:00)

## 2021-06-12 ENCOUNTER — Encounter: Payer: Self-pay | Admitting: Oncology

## 2021-06-17 ENCOUNTER — Other Ambulatory Visit: Payer: Self-pay | Admitting: Oncology

## 2021-06-18 ENCOUNTER — Other Ambulatory Visit: Payer: Self-pay | Admitting: Medical Oncology

## 2021-06-18 DIAGNOSIS — C187 Malignant neoplasm of sigmoid colon: Secondary | ICD-10-CM

## 2021-06-24 ENCOUNTER — Inpatient Hospital Stay: Payer: Managed Care, Other (non HMO) | Attending: Oncology

## 2021-06-24 ENCOUNTER — Other Ambulatory Visit: Payer: Self-pay

## 2021-06-24 DIAGNOSIS — E119 Type 2 diabetes mellitus without complications: Secondary | ICD-10-CM | POA: Diagnosis not present

## 2021-06-24 DIAGNOSIS — Z006 Encounter for examination for normal comparison and control in clinical research program: Secondary | ICD-10-CM | POA: Diagnosis present

## 2021-06-24 DIAGNOSIS — R970 Elevated carcinoembryonic antigen [CEA]: Secondary | ICD-10-CM | POA: Diagnosis not present

## 2021-06-24 DIAGNOSIS — R21 Rash and other nonspecific skin eruption: Secondary | ICD-10-CM | POA: Insufficient documentation

## 2021-06-24 DIAGNOSIS — C187 Malignant neoplasm of sigmoid colon: Secondary | ICD-10-CM | POA: Diagnosis not present

## 2021-06-24 DIAGNOSIS — C787 Secondary malignant neoplasm of liver and intrahepatic bile duct: Secondary | ICD-10-CM | POA: Diagnosis not present

## 2021-06-24 DIAGNOSIS — D696 Thrombocytopenia, unspecified: Secondary | ICD-10-CM | POA: Insufficient documentation

## 2021-06-24 DIAGNOSIS — Z86718 Personal history of other venous thrombosis and embolism: Secondary | ICD-10-CM | POA: Insufficient documentation

## 2021-06-24 LAB — CBC WITH DIFFERENTIAL (CANCER CENTER ONLY)
Abs Immature Granulocytes: 0.02 10*3/uL (ref 0.00–0.07)
Basophils Absolute: 0.1 10*3/uL (ref 0.0–0.1)
Basophils Relative: 1 %
Eosinophils Absolute: 0.3 10*3/uL (ref 0.0–0.5)
Eosinophils Relative: 4 %
HCT: 48.3 % (ref 39.0–52.0)
Hemoglobin: 16.3 g/dL (ref 13.0–17.0)
Immature Granulocytes: 0 %
Lymphocytes Relative: 22 %
Lymphs Abs: 1.5 10*3/uL (ref 0.7–4.0)
MCH: 28.4 pg (ref 26.0–34.0)
MCHC: 33.7 g/dL (ref 30.0–36.0)
MCV: 84.3 fL (ref 80.0–100.0)
Monocytes Absolute: 0.6 10*3/uL (ref 0.1–1.0)
Monocytes Relative: 9 %
Neutro Abs: 4.2 10*3/uL (ref 1.7–7.7)
Neutrophils Relative %: 64 %
Platelet Count: 127 10*3/uL — ABNORMAL LOW (ref 150–400)
RBC: 5.73 MIL/uL (ref 4.22–5.81)
RDW: 14.3 % (ref 11.5–15.5)
WBC Count: 6.7 10*3/uL (ref 4.0–10.5)
nRBC: 0 % (ref 0.0–0.2)

## 2021-06-24 LAB — CMP (CANCER CENTER ONLY)
ALT: 22 U/L (ref 0–44)
AST: 23 U/L (ref 15–41)
Albumin: 4.3 g/dL (ref 3.5–5.0)
Alkaline Phosphatase: 147 U/L — ABNORMAL HIGH (ref 38–126)
Anion gap: 9 (ref 5–15)
BUN: 16 mg/dL (ref 6–20)
CO2: 26 mmol/L (ref 22–32)
Calcium: 9.8 mg/dL (ref 8.9–10.3)
Chloride: 102 mmol/L (ref 98–111)
Creatinine: 1.22 mg/dL (ref 0.61–1.24)
GFR, Estimated: >60 mL/min (ref >60)
Glucose, Bld: 114 mg/dL — ABNORMAL HIGH (ref 70–99)
Potassium: 4.5 mmol/L (ref 3.5–5.1)
Sodium: 137 mmol/L (ref 135–145)
Total Bilirubin: 1 mg/dL (ref 0.3–1.2)
Total Protein: 7 g/dL (ref 6.5–8.1)

## 2021-06-24 LAB — T4, FREE: Free T4: 1.18 ng/dL — ABNORMAL HIGH (ref 0.61–1.12)

## 2021-06-24 LAB — URINALYSIS, COMPLETE (UACMP) WITH MICROSCOPIC
Bilirubin Urine: NEGATIVE
Glucose, UA: NEGATIVE mg/dL
Hgb urine dipstick: NEGATIVE
Ketones, ur: NEGATIVE mg/dL
Leukocytes,Ua: NEGATIVE
Nitrite: NEGATIVE
Protein, ur: 100 mg/dL — AB
Specific Gravity, Urine: 1.021 (ref 1.005–1.030)
pH: 5 (ref 5.0–8.0)

## 2021-06-24 LAB — PROTIME-INR
INR: 1 (ref 0.8–1.2)
Prothrombin Time: 13.1 seconds (ref 11.4–15.2)

## 2021-06-24 LAB — APTT: aPTT: 26 seconds (ref 24–36)

## 2021-06-24 LAB — TSH: TSH: 1.167 u[IU]/mL (ref 0.320–4.118)

## 2021-06-24 LAB — BILIRUBIN, DIRECT: Bilirubin, Direct: 0.3 mg/dL — ABNORMAL HIGH (ref 0.0–0.2)

## 2021-06-24 LAB — RESEARCH LABS

## 2021-06-24 LAB — CEA (IN HOUSE-CHCC): CEA (CHCC-In House): 10.03 ng/mL — ABNORMAL HIGH (ref 0.00–5.00)

## 2021-06-24 LAB — CK: Total CK: 82 U/L (ref 49–397)

## 2021-06-24 LAB — PHOSPHORUS: Phosphorus: 3.8 mg/dL (ref 2.5–4.6)

## 2021-06-24 LAB — MAGNESIUM: Magnesium: 1.5 mg/dL — ABNORMAL LOW (ref 1.7–2.4)

## 2021-06-25 ENCOUNTER — Encounter: Payer: Self-pay | Admitting: Medical Oncology

## 2021-06-25 ENCOUNTER — Inpatient Hospital Stay: Payer: Managed Care, Other (non HMO)

## 2021-06-25 ENCOUNTER — Encounter: Payer: Self-pay | Admitting: Oncology

## 2021-06-25 ENCOUNTER — Inpatient Hospital Stay (HOSPITAL_BASED_OUTPATIENT_CLINIC_OR_DEPARTMENT_OTHER): Payer: Managed Care, Other (non HMO) | Admitting: Oncology

## 2021-06-25 VITALS — BP 125/89 | HR 85 | Temp 97.8°F | Resp 20 | Ht 70.0 in | Wt 235.6 lb

## 2021-06-25 VITALS — BP 127/86 | HR 70 | Temp 97.8°F | Resp 18

## 2021-06-25 DIAGNOSIS — Z006 Encounter for examination for normal comparison and control in clinical research program: Secondary | ICD-10-CM

## 2021-06-25 DIAGNOSIS — C187 Malignant neoplasm of sigmoid colon: Secondary | ICD-10-CM

## 2021-06-25 LAB — T3, FREE: T3, Free: 2.8 pg/mL (ref 2.0–4.4)

## 2021-06-25 MED ORDER — SODIUM CHLORIDE 0.9% FLUSH
10.0000 mL | INTRAVENOUS | Status: DC | PRN
  Administered 2021-06-25: 10 mL

## 2021-06-25 MED ORDER — SODIUM CHLORIDE 0.9 % IV SOLN: Freq: Once | INTRAVENOUS | Status: AC

## 2021-06-25 MED ORDER — INV-SOTORASIB 120 MG TAB AMGEN 20190172: 960.0000 mg | ORAL_TABLET | Freq: Every day | ORAL | 0 refills | Status: DC

## 2021-06-25 MED ORDER — SODIUM CHLORIDE 0.9 % IV SOLN
6.0000 mg/kg | Freq: Once | INTRAVENOUS | Status: AC
  Administered 2021-06-25: 642 mg via INTRAVENOUS
  Filled 2021-06-25: qty 32.1

## 2021-06-25 MED ORDER — MAGNESIUM SULFATE 2 GM/50ML IV SOLN
2.0000 g | Freq: Once | INTRAVENOUS | Status: AC
  Administered 2021-06-25: 2 g via INTRAVENOUS
  Filled 2021-06-25: qty 50

## 2021-06-25 MED ORDER — HEPARIN SOD (PORK) LOCK FLUSH 100 UNIT/ML IV SOLN
500.0000 [IU] | Freq: Once | INTRAVENOUS | Status: AC | PRN
  Administered 2021-06-25: 500 [IU]

## 2021-06-25 NOTE — Progress Notes (Signed)
TRIAL: [place] / A Phase 3 Multicenter, Randomized, Openlabel, Active-controlled Study of Sotorasib and Panitumumab Versus Investigators Choice (Trifluridine and Tipiracil, or Regorafenib) for the Treatment of Previously Treated Metastatic Colorectal Cancer Subjects with KRAS p.G12C Mutation.  Cycle 4/Day 1: 06/24/2021:  labs @ [facility] completed this morning.  Patient presented alone to [facility] for morning lab appointment for the start of cycle 4, day 1. Research Specialist, [name] met with patient to complete the ePRO's for this cycle. Research labs collected per study protocol.    06/25/2021: MD Appointment at [facility]:  Patient presented to [facility], alone, for MD appointment. I met with patient in exam room.Patient reports to be doing well and denies having any new issues or problems since his last visit. Patient denies any issues or difficulties with self administration and documentation of sotorasib. Pre-treatment labs were drawn yesterday and have resulted for MD review. Patient reports his last meal, before lab drawn was approximately around 7 PM Sunday evening on 01/08, and states he hadn't had breakfast before his labs, confirms to have had breakfast after lab drawn  I collected patient's previous dispensed bottles of sotorasib, one bottle returned empty and second bottle had 16 tablets remaining, count verified by PharmD Nadean Corwin. Both bottles returned to pharmacy for verification of return, Rx 2646-0, lot # [id number]. Patient confirms last dose of Sotorasib taken Monday evening (01/09) at approximately 6 PM. Patient denies any difficulty with drug self administration and documentation in electronic drug diary.   PROs: ePROs completed yesterday.  Vital signs: collected and documented, as required per protocol.  H&P: MD assessed patient and reviewed any adverse events and labs. After MD's review of labs and patient assessment, MD cleared patient to start cycle 4 day 1  this morning. Patient continues with Gr1 hypomagnesemia and per MD, to receive magnesium infusion again. All other labs deemed non-clinically significant by MD.  MD assessed patient today with paronychia at fingers and one mouth ulcer present.  Adverse Events/SAE's: Patient with no SAE's. Patient continues to report to be doing well and denies having any concerns. Patient continues with a stable rash to chest, back and face, which per MD,  is drying out. Hypomagnesemia continues at a Grade 1. Platelet count with improvement. See AE's updated below. Research: Sotorasib dispense 2 bottles dispensed and both with 120 pills in each bottle, RX [id number], Lot #[id number] expiration of 09/13/2021.  ECOG: MD assessed, ECOG score of 1. Treatment: After patient was cleared to start treatment, Patient was started on 2 gram of IV magnesium, followed with panitumumab, Lot # [id number], for both vials.  Concomitant medications: patient denies any changes/updates to his medication list. Plan: Patient gave verbal understanding to continue with daily sotorasib 960 mg and documentation of self administration in the study electronic medication diary.  Patient denies further questions today and was thanked for his time and continued support of study. I encouraged him to call Dr. Benay Spice or myself with questions or concerns. Day 15, Cycle 4 will be scheduled for labs 07/08/21, MD and treatment on 07/09/21. Patient informed will have week 16 CT scan scheduled for the week of the 30th.  GUY SEESE [id number]  06/25/2021  Adverse Event Log CTCAE v5.0  Study/Protocol: AMGEN Codebreak Cycle: Cycle 3/Day 15 to start of Cycle 4/Day 1 H/O: HTN-Gr2; Proteinuria-Gr1; Glucosuria- Gr1; hyponatremia- Gr1; Creatinine, elevated- Gr1; ALT, elevated- Gr1; ALK Phosphatase, elevated-Gr2; direct bilirubin, elevated- Gr1; platelet, decreased- Gr1; Hypomagnesemia- Gr 1; Diabetes mellitus, type II; hypoalbuminemia, Gr2;  total bilirubin,  elevated- Gr1;   Event Grade Onset Date Resolved Date Related to Sotorasib Related to Panitumumab  Treatment Comments  Papulopustular rash Gr 2 04/09/21 ongoing no yes OTC hydrocortisone  Doxycycline started 04/01/21   hypomagnesemia Gr 1  04/29/21 ongoing no yes IV magnesium IV magnesium IV magnesium IV magnesium IV magnesium Mag resulted today @ 1.5 Mag 05/14/21 @ 1.5 Mag 05/27/21 @ 1.5 Mag 06/11/21 @ 1.4 Mag 06/25/21 @ 1.5  Paronychia Gr 1 06/25/21 ongoing no yes none Identified at today's visit  Mucositis oral- ulcer Gr 1 06/25/21 ongoing possibly possibly none Identified at today's visit     [name], RN, BSN, [facility] Clinical Research Nurse Lead 04/01/2021 8:49 AM

## 2021-06-25 NOTE — Patient Instructions (Addendum)
[facility]   Discharge Instructions: Thank you for choosing [facility] to provide your oncology and hematology care.   If you have a lab appointment with the [facility], please go directly to the [facility] and check in at the registration area.   Wear comfortable clothing and clothing appropriate for easy access to any Portacath or PICC line.   We strive to give you quality time with your provider. You may need to reschedule your appointment if you arrive late (15 or more minutes).  Arriving late affects you and other patients whose appointments are after yours.  Also, if you miss three or more appointments without notifying the office, you may be dismissed from the clinic at the providers discretion.      For prescription refill requests, have your pharmacy contact our office and allow 72 hours for refills to be completed.    Today you received the following chemotherapy and/or immunotherapy agents INV- Panitumumab      To help prevent nausea and vomiting after your treatment, we encourage you to take your nausea medication as directed.  BELOW ARE SYMPTOMS THAT SHOULD BE REPORTED IMMEDIATELY: *FEVER GREATER THAN 100.4 F (38 C) OR HIGHER *CHILLS OR SWEATING *NAUSEA AND VOMITING THAT IS NOT CONTROLLED WITH YOUR NAUSEA MEDICATION *UNUSUAL SHORTNESS OF BREATH *UNUSUAL BRUISING OR BLEEDING *URINARY PROBLEMS (pain or burning when urinating, or frequent urination) *BOWEL PROBLEMS (unusual diarrhea, constipation, pain near the anus) TENDERNESS IN MOUTH AND THROAT WITH OR WITHOUT PRESENCE OF ULCERS (sore throat, sores in mouth, or a toothache) UNUSUAL RASH, SWELLING OR PAIN  UNUSUAL VAGINAL DISCHARGE OR ITCHING   Items with * indicate a potential emergency and should be followed up as soon as possible or go to the Emergency Department if any problems should occur.  Please show the CHEMOTHERAPY ALERT CARD or IMMUNOTHERAPY ALERT CARD at  check-in to the Emergency Department and triage nurse.  Should you have questions after your visit or need to cancel or reschedule your appointment, please contact [facility]  Dept: [phone]  and follow the prompts.  Office hours are 8:00 a.m. to 4:30 p.m. Monday - Friday. Please note that voicemails left after 4:00 p.m. may not be returned until the following business day.  We are closed weekends and major holidays. You have access to a nurse at all times for urgent questions. Please call the main number to the clinic Dept: [phone] and follow the prompts.   For any non-urgent questions, you may also contact your provider using MyChart. We now offer e-Visits for anyone 16 and older to request care online for non-urgent symptoms. For details visit [email or web address].   Also download the MyChart app! Go to the app store, search "MyChart", open the app, select [facility], and log in with your MyChart username and password.  Due to Covid, a mask is required upon entering the hospital/clinic. If you do not have a mask, one will be given to you upon arrival. For doctor visits, patients may have 1 support person aged 74 or older with them. For treatment visits, patients cannot have anyone with them due to current Covid guidelines and our immunocompromised population.   Magnesium Sulfate Injection What is this medication? MAGNESIUM SULFATE (mag NEE zee um SUL fate) prevents and treats low levels of magnesium in your body. It may also be used to prevent and treat seizures during pregnancy in people with high blood pressure disorders, such as preeclampsia  or eclampsia. Magnesium plays an important role in maintaining the health of your muscles and nervous system. This medicine may be used for other purposes; ask your health care provider or pharmacist if you have questions. What should I tell my care team before I take this medication? They need to know if you have  any of these conditions: Heart disease History of irregular heart beat Kidney disease An unusual or allergic reaction to magnesium sulfate, medications, foods, dyes, or preservatives Pregnant or trying to get pregnant Breast-feeding How should I use this medication? This medication is for infusion into a vein. It is given in a hospital or clinic setting. Talk to your care team about the use of this medication in children. While this medication may be prescribed for selected conditions, precautions do apply. Overdosage: If you think you have taken too much of this medicine contact a poison control center or emergency room at once. NOTE: This medicine is only for you. Do not share this medicine with others. What if I miss a dose? This does not apply. What may interact with this medication? Certain medications for anxiety or sleep Certain medications for seizures like phenobarbital Digoxin Medications that relax muscles for surgery Narcotic medications for pain This list may not describe all possible interactions. Give your health care provider a list of all the medicines, herbs, non-prescription drugs, or dietary supplements you use. Also tell them if you smoke, drink alcohol, or use illegal drugs. Some items may interact with your medicine. What should I watch for while using this medication? Your condition will be monitored carefully while you are receiving this medication. You may need blood work done while you are receiving this medication. What side effects may I notice from receiving this medication? Side effects that you should report to your care team as soon as possible: Allergic reactions--skin rash, itching, hives, swelling of the face, lips, tongue, or throat High magnesium level--confusion, drowsiness, facial flushing, redness, sweating, muscle weakness, fast or irregular heartbeat, trouble breathing Low blood pressure--dizziness, feeling faint or lightheaded, blurry  vision Side effects that usually do not require medical attention (report to your care team if they continue or are bothersome): Headache Nausea This list may not describe all possible side effects. Call your doctor for medical advice about side effects. You may report side effects to FDA at 1-800-FDA-1088. Where should I keep my medication? This medication is given in a hospital or clinic and will not be stored at home. NOTE: This sheet is a summary. It may not cover all possible information. If you have questions about this medicine, talk to your doctor, pharmacist, or health care provider.  2022 Elsevier/Gold Standard (2020-08-16 00:00:00)

## 2021-06-25 NOTE — Progress Notes (Signed)
[facility] OFFICE PROGRESS NOTE   Diagnosis: Colon cancer  INTERVAL HISTORY:   Dan Bell returns as scheduled.  He continues sotorasib and panitumumab.  The skin rash is drying.  No nausea or diarrhea.  He is working.  No complaint.  Objective:  Vital signs in last 24 hours:  Blood pressure 125/89, pulse 85, temperature 97.8 F (36.6 C), temperature source Oral, resp. rate 20, height $RemoveBe'5\' 10"'rdOMnPvGi$  (1.778 m), weight 235 lb 9.6 oz (106.9 kg), SpO2 98 %.    HEENT: 2 mm ulceration at the left pharynx, no thrush Resp: Lungs clear bilaterally Cardio: Regular rate and rhythm GI: No hepatosplenomegaly, nontender Vascular: No leg edema  Skin: Acne type rash over the chest greater than back appears to be drying, few areas of paronychia at the fingers  Portacath/PICC-without erythema  Lab Results:  Lab Results  Component Value Date   WBC 6.7 06/24/2021   HGB 16.3 06/24/2021   HCT 48.3 06/24/2021   MCV 84.3 06/24/2021   PLT 127 (L) 06/24/2021   NEUTROABS 4.2 06/24/2021    CMP  Lab Results  Component Value Date   NA 137 06/24/2021   K 4.5 06/24/2021   CL 102 06/24/2021   CO2 26 06/24/2021   GLUCOSE 114 (H) 06/24/2021   BUN 16 06/24/2021   CREATININE 1.22 06/24/2021   CALCIUM 9.8 06/24/2021   PROT 7.0 06/24/2021   ALBUMIN 4.3 06/24/2021   AST 23 06/24/2021   ALT 22 06/24/2021   ALKPHOS 147 (H) 06/24/2021   BILITOT 1.0 06/24/2021   GFRNONAA >60 06/24/2021    Lab Results  Component Value Date   CEA1 10.03 (H) 06/24/2021    Medications: I have reviewed the patient's current medications.   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, mismatch repair proficient Colonoscopy 07/27/2019-sigmoid colon mass, biopsy-well differentiated adenocarcinoma CTs chest, abdomen, pelvis 07/28/2019-numerous liver metastases, mass in the distal sigmoid colon, necrotic appearing node in the sigmoid mesentery, enlarged porta hepatis and portacaval  nodes Markedly elevated CEA 08/01/2019 Biopsy of liver lesion 08/23/2019-metastatic mucinous adenocarcinoma, CK20 and CDX2 positive FOLFOX plus bevacizumab, cycle 1 08/24/2019, 11 total cycles, oxaliplatin held with cycle 11 secondary to a reaction with cycle 10 FOLFOX plus bevacizumab discontinued after cycle 19 secondary to disease progression FOLFIRI plus bevacizumab beginning 06/20/2020, status post 12 cycles Disease progression on CT 02/11/2021-enlarging lung nodules and liver lesions CEA increased 02/13/2021 CTs 03/11/2021-bulky hypodense liver metastases, slightly progressive, enlargement of bilateral lung nodules, slight enlargement of mediastinal nodes, splenomegaly and gastroesophageal varices Sotorasib/panitumumab on the Codebreak study beginning 04/01/2021 CTs 05/21/2021-decrease size of lung and liver metastases, no new lesions    Type 2 diabetes Left lower extremity DVT 2018 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 Dan Bell appears stable.  He continues to tolerate the sotorasib and panitumumab well.  The CEA is lower the magnesium is mildly decreased.  He will receive IV magnesium supplementation today.  He will continue the current treatment.  He will return for an office visit and panitumumab in 2 weeks.  His ECOG performance status is measured at 1 today.  Betsy Coder, MD  06/25/2021  8:10 AM

## 2021-06-25 NOTE — Progress Notes (Signed)
Patient seen by Dr. Benay Spice today  Vitals are within treatment parameters.  Labs reviewed by Dr. Benay Spice and are within treatment parameters.  Per physician team, patient is ready for treatment and there are NO modifications to the treatment plan. Add 2 grams IV Mg+ today.

## 2021-06-25 NOTE — Progress Notes (Signed)
Patient presents for treatment. RN assessment completed along with the following:  Labs/vitals reviewed - Yes, and Per Dr. Benay Spice, ok to treat with Mg 1.5. Patient to receive IV Mg during visit.     Weight within 10% of previous measurement - Yes Oncology Treatment Attestation completed for current therapy- Yes, on date 04/01/21 Informed consent completed and reflects current therapy/intent - Yes, on date 04/01/21             Provider progress note reviewed - Yes, today's provider note was reviewed. Treatment/Antibody/Supportive plan reviewed - Yes, and there are no adjustments needed for today's treatment. S&H and other orders reviewed -  IV Mg orders entered in [facility] Previous treatment date reviewed - Yes, and the appropriate amount of time has elapsed between treatments. Clinic Hand Off Received from - Merceda Elks, RN  Patient to proceed with treatment.

## 2021-06-27 ENCOUNTER — Encounter: Payer: Self-pay | Admitting: Oncology

## 2021-06-27 NOTE — Progress Notes (Signed)
Edit: Incorrect date and time stamp to visit progress note.  Progress note was created 06/25/2021 approximately 0830  [name], RN, BSN, [facility] Clinical Research Nurse Lead 06/27/2021 4:07 PM

## 2021-07-05 ENCOUNTER — Other Ambulatory Visit: Payer: Self-pay | Admitting: Medical Oncology

## 2021-07-05 DIAGNOSIS — C187 Malignant neoplasm of sigmoid colon: Secondary | ICD-10-CM

## 2021-07-06 ENCOUNTER — Other Ambulatory Visit: Payer: Self-pay | Admitting: Oncology

## 2021-07-08 ENCOUNTER — Other Ambulatory Visit: Payer: Self-pay

## 2021-07-08 ENCOUNTER — Inpatient Hospital Stay: Payer: Managed Care, Other (non HMO)

## 2021-07-08 DIAGNOSIS — C187 Malignant neoplasm of sigmoid colon: Secondary | ICD-10-CM

## 2021-07-08 LAB — CBC WITH DIFFERENTIAL (CANCER CENTER ONLY)
Abs Immature Granulocytes: 0.02 10*3/uL (ref 0.00–0.07)
Basophils Absolute: 0.1 10*3/uL (ref 0.0–0.1)
Basophils Relative: 1 %
Eosinophils Absolute: 0.3 10*3/uL (ref 0.0–0.5)
Eosinophils Relative: 4 %
HCT: 48 % (ref 39.0–52.0)
Hemoglobin: 16.5 g/dL (ref 13.0–17.0)
Immature Granulocytes: 0 %
Lymphocytes Relative: 20 %
Lymphs Abs: 1.3 10*3/uL (ref 0.7–4.0)
MCH: 28.6 pg (ref 26.0–34.0)
MCHC: 34.4 g/dL (ref 30.0–36.0)
MCV: 83.2 fL (ref 80.0–100.0)
Monocytes Absolute: 0.6 10*3/uL (ref 0.1–1.0)
Monocytes Relative: 10 %
Neutro Abs: 4 10*3/uL (ref 1.7–7.7)
Neutrophils Relative %: 65 %
Platelet Count: 142 10*3/uL — ABNORMAL LOW (ref 150–400)
RBC: 5.77 MIL/uL (ref 4.22–5.81)
RDW: 13.7 % (ref 11.5–15.5)
WBC Count: 6.2 10*3/uL (ref 4.0–10.5)
nRBC: 0 % (ref 0.0–0.2)

## 2021-07-08 LAB — BILIRUBIN, DIRECT: Bilirubin, Direct: 0.3 mg/dL — ABNORMAL HIGH (ref 0.0–0.2)

## 2021-07-08 LAB — CMP (CANCER CENTER ONLY)
ALT: 22 U/L (ref 0–44)
AST: 14 U/L — ABNORMAL LOW (ref 15–41)
Albumin: 3.5 g/dL (ref 3.5–5.0)
Alkaline Phosphatase: 123 U/L (ref 38–126)
Anion gap: 9 (ref 5–15)
BUN: 14 mg/dL (ref 6–20)
CO2: 24 mmol/L (ref 22–32)
Calcium: 9.4 mg/dL (ref 8.9–10.3)
Chloride: 104 mmol/L (ref 98–111)
Creatinine: 0.94 mg/dL (ref 0.61–1.24)
GFR, Estimated: >60 mL/min (ref >60)
Glucose, Bld: 119 mg/dL — ABNORMAL HIGH (ref 70–99)
Potassium: 4.8 mmol/L (ref 3.5–5.1)
Sodium: 137 mmol/L (ref 135–145)
Total Bilirubin: 1 mg/dL (ref 0.3–1.2)
Total Protein: 7.3 g/dL (ref 6.5–8.1)

## 2021-07-08 LAB — CK: Total CK: 66 U/L (ref 49–397)

## 2021-07-08 LAB — MAGNESIUM: Magnesium: 1.6 mg/dL — ABNORMAL LOW (ref 1.7–2.4)

## 2021-07-08 LAB — PHOSPHORUS: Phosphorus: 3.4 mg/dL (ref 2.5–4.6)

## 2021-07-09 ENCOUNTER — Inpatient Hospital Stay (HOSPITAL_BASED_OUTPATIENT_CLINIC_OR_DEPARTMENT_OTHER): Payer: Managed Care, Other (non HMO) | Admitting: Oncology

## 2021-07-09 ENCOUNTER — Encounter: Payer: Self-pay | Admitting: Medical Oncology

## 2021-07-09 ENCOUNTER — Encounter: Payer: Self-pay | Admitting: *Deleted

## 2021-07-09 ENCOUNTER — Other Ambulatory Visit: Payer: Managed Care, Other (non HMO)

## 2021-07-09 ENCOUNTER — Inpatient Hospital Stay: Payer: Managed Care, Other (non HMO)

## 2021-07-09 VITALS — BP 126/89 | HR 75

## 2021-07-09 VITALS — BP 137/90 | HR 90 | Temp 98.1°F | Resp 19 | Ht 70.0 in | Wt 237.0 lb

## 2021-07-09 DIAGNOSIS — Z006 Encounter for examination for normal comparison and control in clinical research program: Secondary | ICD-10-CM

## 2021-07-09 DIAGNOSIS — C187 Malignant neoplasm of sigmoid colon: Secondary | ICD-10-CM

## 2021-07-09 MED ORDER — SODIUM CHLORIDE 0.9% FLUSH
10.0000 mL | INTRAVENOUS | Status: DC | PRN
  Administered 2021-07-09: 13:00:00 10 mL

## 2021-07-09 MED ORDER — SODIUM CHLORIDE 0.9 % IV SOLN: Freq: Once | INTRAVENOUS | Status: AC

## 2021-07-09 MED ORDER — SODIUM CHLORIDE 0.9 % IV SOLN
6.0000 mg/kg | Freq: Once | INTRAVENOUS | Status: AC
  Administered 2021-07-09: 12:00:00 642 mg via INTRAVENOUS
  Filled 2021-07-09: qty 32.1

## 2021-07-09 MED ORDER — MAGNESIUM SULFATE 2 GM/50ML IV SOLN
2.0000 g | Freq: Once | INTRAVENOUS | Status: AC
  Administered 2021-07-09: 10:00:00 2 g via INTRAVENOUS
  Filled 2021-07-09: qty 50

## 2021-07-09 MED ORDER — HEPARIN SOD (PORK) LOCK FLUSH 100 UNIT/ML IV SOLN
500.0000 [IU] | Freq: Once | INTRAVENOUS | Status: AC | PRN
  Administered 2021-07-09: 13:00:00 500 [IU]

## 2021-07-09 NOTE — Patient Instructions (Signed)
[facility]   Discharge Instructions: Thank you for choosing [facility] to provide your oncology and hematology care.   If you have a lab appointment with the [facility], please go directly to the [facility] and check in at the registration area.   Wear comfortable clothing and clothing appropriate for easy access to any Portacath or PICC line.   We strive to give you quality time with your provider. You may need to reschedule your appointment if you arrive late (15 or more minutes).  Arriving late affects you and other patients whose appointments are after yours.  Also, if you miss three or more appointments without notifying the office, you may be dismissed from the clinic at the providers discretion.      For prescription refill requests, have your pharmacy contact our office and allow 72 hours for refills to be completed.    Today you received the following chemotherapy and/or immunotherapy agents INV- Panitumumab      To help prevent nausea and vomiting after your treatment, we encourage you to take your nausea medication as directed.  BELOW ARE SYMPTOMS THAT SHOULD BE REPORTED IMMEDIATELY: *FEVER GREATER THAN 100.4 F (38 C) OR HIGHER *CHILLS OR SWEATING *NAUSEA AND VOMITING THAT IS NOT CONTROLLED WITH YOUR NAUSEA MEDICATION *UNUSUAL SHORTNESS OF BREATH *UNUSUAL BRUISING OR BLEEDING *URINARY PROBLEMS (pain or burning when urinating, or frequent urination) *BOWEL PROBLEMS (unusual diarrhea, constipation, pain near the anus) TENDERNESS IN MOUTH AND THROAT WITH OR WITHOUT PRESENCE OF ULCERS (sore throat, sores in mouth, or a toothache) UNUSUAL RASH, SWELLING OR PAIN  UNUSUAL VAGINAL DISCHARGE OR ITCHING   Items with * indicate a potential emergency and should be followed up as soon as possible or go to the Emergency Department if any problems should occur.  Please show the CHEMOTHERAPY ALERT CARD or IMMUNOTHERAPY ALERT CARD at  check-in to the Emergency Department and triage nurse.  Should you have questions after your visit or need to cancel or reschedule your appointment, please contact [facility]  Dept: [phone]  and follow the prompts.  Office hours are 8:00 a.m. to 4:30 p.m. Monday - Friday. Please note that voicemails left after 4:00 p.m. may not be returned until the following business day.  We are closed weekends and major holidays. You have access to a nurse at all times for urgent questions. Please call the main number to the clinic Dept: [phone] and follow the prompts.   For any non-urgent questions, you may also contact your provider using MyChart. We now offer e-Visits for anyone 73 and older to request care online for non-urgent symptoms. For details visit [email or web address].   Also download the MyChart app! Go to the app store, search "MyChart", open the app, select [facility], and log in with your MyChart username and password.  Due to Covid, a mask is required upon entering the hospital/clinic. If you do not have a mask, one will be given to you upon arrival. For doctor visits, patients may have 1 support person aged 109 or older with them. For treatment visits, patients cannot have anyone with them due to current Covid guidelines and our immunocompromised population.   Magnesium Sulfate Injection What is this medication? MAGNESIUM SULFATE (mag NEE zee um SUL fate) prevents and treats low levels of magnesium in your body. It may also be used to prevent and treat seizures during pregnancy in people with high blood pressure disorders, such as preeclampsia  or eclampsia. Magnesium plays an important role in maintaining the health of your muscles and nervous system. This medicine may be used for other purposes; ask your health care provider or pharmacist if you have questions. What should I tell my care team before I take this medication? They need to know if you have  any of these conditions: Heart disease History of irregular heart beat Kidney disease An unusual or allergic reaction to magnesium sulfate, medications, foods, dyes, or preservatives Pregnant or trying to get pregnant Breast-feeding How should I use this medication? This medication is for infusion into a vein. It is given in a hospital or clinic setting. Talk to your care team about the use of this medication in children. While this medication may be prescribed for selected conditions, precautions do apply. Overdosage: If you think you have taken too much of this medicine contact a poison control center or emergency room at once. NOTE: This medicine is only for you. Do not share this medicine with others. What if I miss a dose? This does not apply. What may interact with this medication? Certain medications for anxiety or sleep Certain medications for seizures like phenobarbital Digoxin Medications that relax muscles for surgery Narcotic medications for pain This list may not describe all possible interactions. Give your health care provider a list of all the medicines, herbs, non-prescription drugs, or dietary supplements you use. Also tell them if you smoke, drink alcohol, or use illegal drugs. Some items may interact with your medicine. What should I watch for while using this medication? Your condition will be monitored carefully while you are receiving this medication. You may need blood work done while you are receiving this medication. What side effects may I notice from receiving this medication? Side effects that you should report to your care team as soon as possible: Allergic reactions--skin rash, itching, hives, swelling of the face, lips, tongue, or throat High magnesium level--confusion, drowsiness, facial flushing, redness, sweating, muscle weakness, fast or irregular heartbeat, trouble breathing Low blood pressure--dizziness, feeling faint or lightheaded, blurry  vision Side effects that usually do not require medical attention (report to your care team if they continue or are bothersome): Headache Nausea This list may not describe all possible side effects. Call your doctor for medical advice about side effects. You may report side effects to FDA at 1-800-FDA-1088. Where should I keep my medication? This medication is given in a hospital or clinic and will not be stored at home. NOTE: This sheet is a summary. It may not cover all possible information. If you have questions about this medicine, talk to your doctor, pharmacist, or health care provider.  2022 Elsevier/Gold Standard (2020-08-16 00:00:00)

## 2021-07-09 NOTE — Progress Notes (Signed)
Patient presents for treatment. RN assessment completed along with the following:  Labs/vitals reviewed - Yes, and Per Dr. Benay Spice, ok to treat with Mg 1.6. Patient to receive Mg infusion during visit today.     Weight within 10% of previous measurement - Yes Informed consent completed and reflects current therapy/intent - Yes, on date 04/01/21             Provider progress note reviewed - Yes, today's provider note was reviewed. Treatment/Antibody/Supportive plan reviewed - Yes, and there are no adjustments needed for today's treatment. S&H and other orders reviewed - Yes, and orders for 2 g IV Mg are in.  Previous treatment date reviewed - Yes, and the appropriate amount of time has elapsed between treatments. Clinic Hand Off Received from - Merceda Elks, RN  Patient to proceed with treatment.

## 2021-07-09 NOTE — Progress Notes (Signed)
Patient seen by Dr. Benay Spice today  Vitals are within treatment parameters.  Labs reviewed by Dr. Benay Spice and are within treatment parameters.  Per physician team, patient is ready for treatment. Please note that modifications are being made to the treatment plan including Same chemo. Will need to add 2 grams IV Mg+ today

## 2021-07-09 NOTE — Progress Notes (Signed)
TRIAL: [place] / A Phase 3 Multicenter, Randomized, Openlabel, Active-controlled Study of Sotorasib and Panitumumab Versus Investigators Choice (Trifluridine and Tipiracil, or Regorafenib) for the Treatment of Previously Treated Metastatic Colorectal Cancer Subjects with KRAS p.G12C Mutation.    Cycle 4/Day 15: 07/08/2021 labs @ [facility] completed this morning.  Patient presented alone to [facility] for morning lab appointment for the start of cycle 4, day 15. Labs drawn as required per protocol.   07/09/2021: MD Appointment at [facility]:  Patient arrived to [facility] for MD appointment. I met with patient in exam room.Patient reports to be doing well and denies having any new issues or problems since his last visit. Patient confirms to be maintaining his working hours and reports energy levels are within his normal. Patient denies any issues or difficulties with self administration and documentation of sotorasib. Pre-treatment labs were drawn yesterday morning and have resulted for MD review. Patient reports to have been fasting and had breakfast following lab collection. Patient confirms last dose of sotorasib, before collection of labs, was taken approximately at dinner 1800, Sunday evening. Monday's dose of sotorasib was taken at 1800 with dinner.  Vital signs: collected and documented, as required per protocol.  H&P: MD assessed patient and reviewed medications, any adverse events and labs. After MD's review of labs and patient assessment, MD cleared patient to start cycle 4 day 15 this morning. Patient continues with Gr1 hypomagnesemia and per MD, to receive magnesium infusion again. All other labs deemed non-clinically significant by MD.  Adverse Events/SAE's: Patient with no SAE's. Patient continues to report to be doing well and denies having any new or worsening issues or concerns. Patient continues with  a stable rash to chest, back and face, which is "drying out", MD assessed.  Hypomagnesemia at a Grade 1. Platelet count continues at a Gr 1, but with improvement.  Research: Sotorasib dispense None dispensed at this visit.  ECOG: MD assessed, ECOG score of 1. Treatment: After patient was cleared to start treatment, Patient was started on 2 gram of IV magnesium, followed with panitumumab, Lot # [id number], for both vials.   Concomitant medications: patient denies any changes/updates to his medication list. Imaging: Patient has been scheduled for week 16 imaging on the 31st of January. Patient was provided with two bottles of contrast to drink as well as instructions of when to drink.  Plan: Patient gave verbal understanding to continue with daily sotorasib and documentation of self administration in the study electronic medication diary.  Patient denies further questions today and was thanked for his time and continued support of study. I encouraged him to call Dr. Benay Spice or myself with questions or concerns. Day 1, Cycle 5 is scheduled for labs 07/22/21, MD and treatment on 07/23/21.  Adverse Event Log CTCAE v5.0  Study/Protocol: AMGEN Codebreak Cycle: Cycle 4/Day 1 to start of Cycle 4/Day 15 H/O: HTN-Gr2; Proteinuria-Gr1; Glucosuria- Gr1; hyponatremia- Gr1; Creatinine, elevated- Gr1; ALT, elevated- Gr1; ALK Phosphatase, elevated-Gr2; direct bilirubin, elevated- Gr1; platelet, decreased- Gr1; Hypomagnesemia- Gr 1; Diabetes mellitus, type II; hypoalbuminemia, Gr2; total bilirubin, elevated- Gr1;   Event Grade Onset Date Resolved Date Related to Sotorasib Related to Panitumumab  Treatment Comments  Papulopustular rash Gr 2 04/09/21 ongoing no yes OTC hydrocortisone  Doxycycline started 04/01/21   hypomagnesemia Gr 1  04/29/21 ongoing no yes _0  IV magnesium Mag resulted today @ 1.5 Mag 05/14/21 @ 1.5 Mag 05/27/21 @ 1.5 Mag 06/11/21 @ 1.4 Mag  06/25/21 @ 1.5 Mag 07/09/21 @ 1.6  Paronychia Gr 1 06/25/21  ongoing no yes none Identified at 06/25/21 visit  Mucositis oral- ulcer Gr 1 06/25/21 ongoing possibly possibly none Identified at 06/25/21 visit     [name], RN, BSN, [facility] Clinical Research Nurse Lead 07/09/2021 1:55 PM

## 2021-07-09 NOTE — Progress Notes (Signed)
[facility] OFFICE PROGRESS NOTE   Diagnosis: Colon cancer  INTERVAL HISTORY:   Mr. Canelo returns as scheduled.  He continues sotorasib.  He was last treated with panitumumab 06/25/2021.  The skin rash is "dry".  No diarrhea or nausea.  No new complaint.  He is working.  Good appetite.  Objective:  Vital signs in last 24 hours:  Blood pressure 137/90, pulse 90, temperature 98.1 F (36.7 C), temperature source Oral, resp. rate 19, height $RemoveBe'5\' 10"'JTrpJnpry$  (1.778 m), weight 237 lb (107.5 kg), SpO2 100 %.    HEENT: No thrush, erythema at the left posterior palate Resp: Lungs clear bilaterally Cardio: Regular rate and rhythm GI: No hepatosplenomegaly, nontender Vascular: No leg edema  Skin: Mild paronychia at the fingers, drying acne type rash over the trunk  Portacath/PICC-without erythema  Lab Results:  Lab Results  Component Value Date   WBC 6.2 07/08/2021   HGB 16.5 07/08/2021   HCT 48.0 07/08/2021   MCV 83.2 07/08/2021   PLT 142 (L) 07/08/2021   NEUTROABS 4.0 07/08/2021    CMP  Lab Results  Component Value Date   NA 137 07/08/2021   K 4.8 07/08/2021   CL 104 07/08/2021   CO2 24 07/08/2021   GLUCOSE 119 (H) 07/08/2021   BUN 14 07/08/2021   CREATININE 0.94 07/08/2021   CALCIUM 9.4 07/08/2021   PROT 7.3 07/08/2021   ALBUMIN 3.5 07/08/2021   AST 14 (L) 07/08/2021   ALT 22 07/08/2021   ALKPHOS 123 07/08/2021   BILITOT 1.0 07/08/2021   GFRNONAA >60 07/08/2021    Lab Results  Component Value Date   CEA1 10.03 (H) 06/24/2021    Medications: I have reviewed the patient's current medications.   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, mismatch repair proficient Colonoscopy 07/27/2019-sigmoid colon mass, biopsy-well differentiated adenocarcinoma CTs chest, abdomen, pelvis 07/28/2019-numerous liver metastases, mass in the distal sigmoid colon, necrotic appearing node in the sigmoid mesentery, enlarged porta hepatis and  portacaval nodes Markedly elevated CEA 08/01/2019 Biopsy of liver lesion 08/23/2019-metastatic mucinous adenocarcinoma, CK20 and CDX2 positive FOLFOX plus bevacizumab, cycle 1 08/24/2019, 11 total cycles, oxaliplatin held with cycle 11 secondary to a reaction with cycle 10 FOLFOX plus bevacizumab discontinued after cycle 19 secondary to disease progression FOLFIRI plus bevacizumab beginning 06/20/2020, status post 12 cycles Disease progression on CT 02/11/2021-enlarging lung nodules and liver lesions CEA increased 02/13/2021 CTs 03/11/2021-bulky hypodense liver metastases, slightly progressive, enlargement of bilateral lung nodules, slight enlargement of mediastinal nodes, splenomegaly and gastroesophageal varices Sotorasib/panitumumab on the Codebreak study beginning 04/01/2021 CTs 05/21/2021-decrease size of lung and liver metastases, no new lesions    Type 2 diabetes Left lower extremity DVT 2018 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 Wild appears stable.  He continues to tolerate the sotorasib and panitumumab well.  He will complete another treatment panitumumab today.  He will receive IV magnesium supplementation today.  He is scheduled undergo restaging CTs next week.  Mr. Bartling will return for an office visit in 2 weeks.  His ECOG performance status is measured at 1 today.  Betsy Coder, MD  07/09/2021  9:55 AM

## 2021-07-10 ENCOUNTER — Encounter: Payer: Self-pay | Admitting: Oncology

## 2021-07-10 ENCOUNTER — Other Ambulatory Visit: Payer: Self-pay | Admitting: Medical Oncology

## 2021-07-16 ENCOUNTER — Ambulatory Visit (HOSPITAL_COMMUNITY)
Admission: RE | Admit: 2021-07-16 | Discharge: 2021-07-16 | Disposition: A | Discharge status: Home / Self Care | Payer: Self-pay | Source: Ambulatory Visit | Attending: Oncology | Admitting: Oncology

## 2021-07-16 ENCOUNTER — Encounter (HOSPITAL_COMMUNITY): Payer: Self-pay

## 2021-07-16 ENCOUNTER — Other Ambulatory Visit: Payer: Self-pay

## 2021-07-16 DIAGNOSIS — C187 Malignant neoplasm of sigmoid colon: Secondary | ICD-10-CM | POA: Insufficient documentation

## 2021-07-16 MED ORDER — SODIUM CHLORIDE (PF) 0.9 % IJ SOLN
INTRAMUSCULAR | Status: AC
  Filled 2021-07-16: qty 50

## 2021-07-16 MED ORDER — IOHEXOL 300 MG/ML  SOLN
100.0000 mL | Freq: Once | INTRAMUSCULAR | Status: AC | PRN
  Administered 2021-07-16: 100 mL via INTRAVENOUS

## 2021-07-19 ENCOUNTER — Other Ambulatory Visit: Payer: Self-pay | Admitting: Medical Oncology

## 2021-07-19 DIAGNOSIS — C187 Malignant neoplasm of sigmoid colon: Secondary | ICD-10-CM

## 2021-07-22 ENCOUNTER — Other Ambulatory Visit: Payer: Managed Care, Other (non HMO)

## 2021-07-22 ENCOUNTER — Other Ambulatory Visit: Payer: Self-pay

## 2021-07-22 ENCOUNTER — Inpatient Hospital Stay: Payer: Managed Care, Other (non HMO) | Attending: Oncology

## 2021-07-22 DIAGNOSIS — Z006 Encounter for examination for normal comparison and control in clinical research program: Secondary | ICD-10-CM | POA: Diagnosis present

## 2021-07-22 DIAGNOSIS — C187 Malignant neoplasm of sigmoid colon: Secondary | ICD-10-CM | POA: Insufficient documentation

## 2021-07-22 DIAGNOSIS — C7800 Secondary malignant neoplasm of unspecified lung: Secondary | ICD-10-CM | POA: Diagnosis not present

## 2021-07-22 DIAGNOSIS — R970 Elevated carcinoembryonic antigen [CEA]: Secondary | ICD-10-CM | POA: Insufficient documentation

## 2021-07-22 DIAGNOSIS — C787 Secondary malignant neoplasm of liver and intrahepatic bile duct: Secondary | ICD-10-CM | POA: Diagnosis not present

## 2021-07-22 LAB — URINALYSIS, COMPLETE (UACMP) WITH MICROSCOPIC
Bilirubin Urine: NEGATIVE
Glucose, UA: NEGATIVE mg/dL
Hgb urine dipstick: NEGATIVE
Ketones, ur: NEGATIVE mg/dL
Leukocytes,Ua: NEGATIVE
Nitrite: NEGATIVE
Protein, ur: NEGATIVE mg/dL
Specific Gravity, Urine: 1.02 (ref 1.005–1.030)
pH: 6 (ref 5.0–8.0)

## 2021-07-22 LAB — CMP (CANCER CENTER ONLY)
ALT: 19 U/L (ref 0–44)
AST: 19 U/L (ref 15–41)
Albumin: 4.3 g/dL (ref 3.5–5.0)
Alkaline Phosphatase: 173 U/L — ABNORMAL HIGH (ref 38–126)
Anion gap: 10 (ref 5–15)
BUN: 18 mg/dL (ref 6–20)
CO2: 24 mmol/L (ref 22–32)
Calcium: 9.8 mg/dL (ref 8.9–10.3)
Chloride: 101 mmol/L (ref 98–111)
Creatinine: 1.23 mg/dL (ref 0.61–1.24)
GFR, Estimated: >60 mL/min (ref >60)
Glucose, Bld: 146 mg/dL — ABNORMAL HIGH (ref 70–99)
Potassium: 4.9 mmol/L (ref 3.5–5.1)
Sodium: 135 mmol/L (ref 135–145)
Total Bilirubin: 1.3 mg/dL — ABNORMAL HIGH (ref 0.3–1.2)
Total Protein: 7.4 g/dL (ref 6.5–8.1)

## 2021-07-22 LAB — CBC WITH DIFFERENTIAL (CANCER CENTER ONLY)
Abs Immature Granulocytes: 0.03 10*3/uL (ref 0.00–0.07)
Basophils Absolute: 0.1 10*3/uL (ref 0.0–0.1)
Basophils Relative: 1 %
Eosinophils Absolute: 0.2 10*3/uL (ref 0.0–0.5)
Eosinophils Relative: 3 %
HCT: 46.2 % (ref 39.0–52.0)
Hemoglobin: 15.3 g/dL (ref 13.0–17.0)
Immature Granulocytes: 0 %
Lymphocytes Relative: 18 %
Lymphs Abs: 1.3 10*3/uL (ref 0.7–4.0)
MCH: 27.8 pg (ref 26.0–34.0)
MCHC: 33.1 g/dL (ref 30.0–36.0)
MCV: 84 fL (ref 80.0–100.0)
Monocytes Absolute: 0.7 10*3/uL (ref 0.1–1.0)
Monocytes Relative: 10 %
Neutro Abs: 5 10*3/uL (ref 1.7–7.7)
Neutrophils Relative %: 68 %
Platelet Count: 142 10*3/uL — ABNORMAL LOW (ref 150–400)
RBC: 5.5 MIL/uL (ref 4.22–5.81)
RDW: 13.5 % (ref 11.5–15.5)
WBC Count: 7.4 10*3/uL (ref 4.0–10.5)
nRBC: 0 % (ref 0.0–0.2)

## 2021-07-22 LAB — LIPID PANEL
Cholesterol: 181 mg/dL (ref 0–200)
HDL: 37 mg/dL — ABNORMAL LOW (ref >40)
LDL Cholesterol: 120 mg/dL — ABNORMAL HIGH (ref 0–99)
Total CHOL/HDL Ratio: 4.9 RATIO
Triglycerides: 121 mg/dL (ref <150)
VLDL: 24 mg/dL (ref 0–40)

## 2021-07-22 LAB — APTT: aPTT: 27 seconds (ref 24–36)

## 2021-07-22 LAB — PHOSPHORUS: Phosphorus: 3.4 mg/dL (ref 2.5–4.6)

## 2021-07-22 LAB — MAGNESIUM: Magnesium: 1.4 mg/dL — ABNORMAL LOW (ref 1.7–2.4)

## 2021-07-22 LAB — CK: Total CK: 65 U/L (ref 49–397)

## 2021-07-22 LAB — PROTIME-INR
INR: 1 (ref 0.8–1.2)
Prothrombin Time: 13.2 seconds (ref 11.4–15.2)

## 2021-07-22 LAB — RESEARCH LABS

## 2021-07-22 LAB — BILIRUBIN, DIRECT: Bilirubin, Direct: 0.4 mg/dL — ABNORMAL HIGH (ref 0.0–0.2)

## 2021-07-22 LAB — CEA (IN HOUSE-CHCC): CEA (CHCC-In House): 7.53 ng/mL — ABNORMAL HIGH (ref 0.00–5.00)

## 2021-07-22 LAB — TSH: TSH: 2.963 u[IU]/mL (ref 0.320–4.118)

## 2021-07-22 LAB — T4, FREE: Free T4: 0.99 ng/dL (ref 0.61–1.12)

## 2021-07-23 ENCOUNTER — Inpatient Hospital Stay (HOSPITAL_BASED_OUTPATIENT_CLINIC_OR_DEPARTMENT_OTHER): Payer: Managed Care, Other (non HMO) | Admitting: Oncology

## 2021-07-23 ENCOUNTER — Encounter: Payer: Self-pay | Admitting: *Deleted

## 2021-07-23 ENCOUNTER — Inpatient Hospital Stay: Payer: Managed Care, Other (non HMO)

## 2021-07-23 ENCOUNTER — Encounter: Payer: Self-pay | Admitting: Medical Oncology

## 2021-07-23 ENCOUNTER — Encounter: Payer: Self-pay | Admitting: Oncology

## 2021-07-23 VITALS — BP 130/90 | HR 95 | Temp 98.1°F | Resp 18 | Ht 70.0 in | Wt 233.8 lb

## 2021-07-23 DIAGNOSIS — C187 Malignant neoplasm of sigmoid colon: Secondary | ICD-10-CM | POA: Diagnosis not present

## 2021-07-23 DIAGNOSIS — C787 Secondary malignant neoplasm of liver and intrahepatic bile duct: Secondary | ICD-10-CM | POA: Diagnosis not present

## 2021-07-23 DIAGNOSIS — Z006 Encounter for examination for normal comparison and control in clinical research program: Secondary | ICD-10-CM | POA: Diagnosis present

## 2021-07-23 DIAGNOSIS — C7800 Secondary malignant neoplasm of unspecified lung: Secondary | ICD-10-CM | POA: Diagnosis not present

## 2021-07-23 DIAGNOSIS — R970 Elevated carcinoembryonic antigen [CEA]: Secondary | ICD-10-CM | POA: Diagnosis not present

## 2021-07-23 LAB — T3, FREE: T3, Free: 3 pg/mL (ref 2.0–4.4)

## 2021-07-23 MED ORDER — SODIUM CHLORIDE 0.9 % IV SOLN: Freq: Once | INTRAVENOUS | Status: AC

## 2021-07-23 MED ORDER — SODIUM CHLORIDE 0.9 % IV SOLN
6.0000 mg/kg | Freq: Once | INTRAVENOUS | Status: AC
  Administered 2021-07-23: 642 mg via INTRAVENOUS
  Filled 2021-07-23 (×2): qty 32.1

## 2021-07-23 MED ORDER — INV-SOTORASIB 120 MG TAB AMGEN 20190172
960.0000 mg | ORAL_TABLET | Freq: Every day | ORAL | Status: DC
  Filled 2021-07-23: qty 8

## 2021-07-23 MED ORDER — MAGNESIUM SULFATE 4 GM/100ML IV SOLN
4.0000 g | Freq: Once | INTRAVENOUS | Status: AC
  Administered 2021-07-23: 4 g via INTRAVENOUS
  Filled 2021-07-23: qty 100

## 2021-07-23 MED ORDER — SODIUM CHLORIDE 0.9% FLUSH
10.0000 mL | INTRAVENOUS | Status: DC | PRN
  Administered 2021-07-23: 10 mL

## 2021-07-23 MED ORDER — HEPARIN SOD (PORK) LOCK FLUSH 100 UNIT/ML IV SOLN
500.0000 [IU] | Freq: Once | INTRAVENOUS | Status: AC | PRN
  Administered 2021-07-23: 500 [IU]

## 2021-07-23 NOTE — Patient Instructions (Signed)
[facility]   Discharge Instructions: Thank you for choosing [facility] to provide your oncology and hematology care.   If you have a lab appointment with the [facility], please go directly to the [facility] and check in at the registration area.   Wear comfortable clothing and clothing appropriate for easy access to any Portacath or PICC line.   We strive to give you quality time with your provider. You may need to reschedule your appointment if you arrive late (15 or more minutes).  Arriving late affects you and other patients whose appointments are after yours.  Also, if you miss three or more appointments without notifying the office, you may be dismissed from the clinic at the providers discretion.      For prescription refill requests, have your pharmacy contact our office and allow 72 hours for refills to be completed.    Today you received the following chemotherapy and/or immunotherapy agents INV- Panitumumab      To help prevent nausea and vomiting after your treatment, we encourage you to take your nausea medication as directed.  BELOW ARE SYMPTOMS THAT SHOULD BE REPORTED IMMEDIATELY: *FEVER GREATER THAN 100.4 F (38 C) OR HIGHER *CHILLS OR SWEATING *NAUSEA AND VOMITING THAT IS NOT CONTROLLED WITH YOUR NAUSEA MEDICATION *UNUSUAL SHORTNESS OF BREATH *UNUSUAL BRUISING OR BLEEDING *URINARY PROBLEMS (pain or burning when urinating, or frequent urination) *BOWEL PROBLEMS (unusual diarrhea, constipation, pain near the anus) TENDERNESS IN MOUTH AND THROAT WITH OR WITHOUT PRESENCE OF ULCERS (sore throat, sores in mouth, or a toothache) UNUSUAL RASH, SWELLING OR PAIN  UNUSUAL VAGINAL DISCHARGE OR ITCHING   Items with * indicate a potential emergency and should be followed up as soon as possible or go to the Emergency Department if any problems should occur.  Please show the CHEMOTHERAPY ALERT CARD or IMMUNOTHERAPY ALERT CARD at  check-in to the Emergency Department and triage nurse.  Should you have questions after your visit or need to cancel or reschedule your appointment, please contact [facility]  Dept: [phone]  and follow the prompts.  Office hours are 8:00 a.m. to 4:30 p.m. Monday - Friday. Please note that voicemails left after 4:00 p.m. may not be returned until the following business day.  We are closed weekends and major holidays. You have access to a nurse at all times for urgent questions. Please call the main number to the clinic Dept: [phone] and follow the prompts.   For any non-urgent questions, you may also contact your provider using MyChart. We now offer e-Visits for anyone 72 and older to request care online for non-urgent symptoms. For details visit [email or web address].   Also download the MyChart app! Go to the app store, search "MyChart", open the app, select [facility], and log in with your MyChart username and password.  Due to Covid, a mask is required upon entering the hospital/clinic. If you do not have a mask, one will be given to you upon arrival. For doctor visits, patients may have 1 support person aged 31 or older with them. For treatment visits, patients cannot have anyone with them due to current Covid guidelines and our immunocompromised population.   Magnesium Sulfate Injection What is this medication? MAGNESIUM SULFATE (mag NEE zee um SUL fate) prevents and treats low levels of magnesium in your body. It may also be used to prevent and treat seizures during pregnancy in people with high blood pressure disorders, such as preeclampsia  or eclampsia. Magnesium plays an important role in maintaining the health of your muscles and nervous system. This medicine may be used for other purposes; ask your health care provider or pharmacist if you have questions. What should I tell my care team before I take this medication? They need to know if you have  any of these conditions: Heart disease History of irregular heart beat Kidney disease An unusual or allergic reaction to magnesium sulfate, medications, foods, dyes, or preservatives Pregnant or trying to get pregnant Breast-feeding How should I use this medication? This medication is for infusion into a vein. It is given in a hospital or clinic setting. Talk to your care team about the use of this medication in children. While this medication may be prescribed for selected conditions, precautions do apply. Overdosage: If you think you have taken too much of this medicine contact a poison control center or emergency room at once. NOTE: This medicine is only for you. Do not share this medicine with others. What if I miss a dose? This does not apply. What may interact with this medication? Certain medications for anxiety or sleep Certain medications for seizures like phenobarbital Digoxin Medications that relax muscles for surgery Narcotic medications for pain This list may not describe all possible interactions. Give your health care provider a list of all the medicines, herbs, non-prescription drugs, or dietary supplements you use. Also tell them if you smoke, drink alcohol, or use illegal drugs. Some items may interact with your medicine. What should I watch for while using this medication? Your condition will be monitored carefully while you are receiving this medication. You may need blood work done while you are receiving this medication. What side effects may I notice from receiving this medication? Side effects that you should report to your care team as soon as possible: Allergic reactions--skin rash, itching, hives, swelling of the face, lips, tongue, or throat High magnesium level--confusion, drowsiness, facial flushing, redness, sweating, muscle weakness, fast or irregular heartbeat, trouble breathing Low blood pressure--dizziness, feeling faint or lightheaded, blurry  vision Side effects that usually do not require medical attention (report to your care team if they continue or are bothersome): Headache Nausea This list may not describe all possible side effects. Call your doctor for medical advice about side effects. You may report side effects to FDA at 1-800-FDA-1088. Where should I keep my medication? This medication is given in a hospital or clinic and will not be stored at home. NOTE: This sheet is a summary. It may not cover all possible information. If you have questions about this medicine, talk to your doctor, pharmacist, or health care provider.  2022 Elsevier/Gold Standard (2020-08-16 00:00:00)

## 2021-07-23 NOTE — Progress Notes (Signed)
Patient seen by Dr. Benay Spice today  Vitals are within treatment parameters.  Labs reviewed by Dr. Benay Spice and are within treatment parameters.  Per physician team, patient is ready for treatment. Please note that modifications are being made to the treatment plan including Give 4 grams IV Mg+ today

## 2021-07-23 NOTE — Progress Notes (Signed)
[facility] OFFICE PROGRESS NOTE   Diagnosis: Colon cancer  INTERVAL HISTORY:   Dan Bell returns as scheduled.  He feels well.  The skin rash has improved.  He is working.  No new complaint.  He continues sotorasib.  He was last treated with panitumumab on 07/09/2021.  Objective:  Vital signs in last 24 hours:  Blood pressure 130/90, pulse 95, temperature 98.1 F (36.7 C), temperature source Oral, resp. rate 18, height '5\' 10"'  (1.778 m), weight 233 lb 12.8 oz (106.1 kg), SpO2 98 %.    HEENT: 2 mm ulcer at the posterior left buccal mucosa, no thrush Resp: Lungs clear bilaterally Cardio: Regular rate and rhythm GI: No hepatosplenomegaly, nontender Vascular: No leg edema  Skin: Mild acne type rash over the trunk  Portacath/PICC-without erythema  Lab Results:  Lab Results  Component Value Date   WBC 7.4 07/22/2021   HGB 15.3 07/22/2021   HCT 46.2 07/22/2021   MCV 84.0 07/22/2021   PLT 142 (L) 07/22/2021   NEUTROABS 5.0 07/22/2021    CMP  Lab Results  Component Value Date   NA 135 07/22/2021   K 4.9 07/22/2021   CL 101 07/22/2021   CO2 24 07/22/2021   GLUCOSE 146 (H) 07/22/2021   BUN 18 07/22/2021   CREATININE 1.23 07/22/2021   CALCIUM 9.8 07/22/2021   PROT 7.4 07/22/2021   ALBUMIN 4.3 07/22/2021   AST 19 07/22/2021   ALT 19 07/22/2021   ALKPHOS 173 (H) 07/22/2021   BILITOT 1.3 (H) 07/22/2021   GFRNONAA >60 07/22/2021    Lab Results  Component Value Date   CEA1 7.53 (H) 07/22/2021     Medications: I have reviewed the patient's current medications.   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, mismatch repair proficient Colonoscopy 07/27/2019-sigmoid colon mass, biopsy-well differentiated adenocarcinoma CTs chest, abdomen, pelvis 07/28/2019-numerous liver metastases, mass in the distal sigmoid colon, necrotic appearing node in the sigmoid mesentery, enlarged porta hepatis and portacaval nodes Markedly  elevated CEA 08/01/2019 Biopsy of liver lesion 08/23/2019-metastatic mucinous adenocarcinoma, CK20 and CDX2 positive FOLFOX plus bevacizumab, cycle 1 08/24/2019, 11 total cycles, oxaliplatin held with cycle 11 secondary to a reaction with cycle 10 FOLFOX plus bevacizumab discontinued after cycle 19 secondary to disease progression FOLFIRI plus bevacizumab beginning 06/20/2020, status post 12 cycles Disease progression on CT 02/11/2021-enlarging lung nodules and liver lesions CEA increased 02/13/2021 CTs 03/11/2021-bulky hypodense liver metastases, slightly progressive, enlargement of bilateral lung nodules, slight enlargement of mediastinal nodes, splenomegaly and gastroesophageal varices Sotorasib/panitumumab on the Codebreak study beginning 04/01/2021 CTs 05/21/2021-decrease size of lung and liver metastases, no new lesions CTs 07/16/2021-new and enlarged lung lesions, increased size of liver lesions, no new lesions, no new site of metastatic disease Sotorasib/panitumumab continued to 07/23/2021    Type 2 diabetes Left lower extremity DVT 2018 Oxaliplatin reaction with cycle 10 Skin rash most likely related to Panitumumab, grade 2 04/15/2021 Thrombocytopenia Hypomagnesemia secondary to panitumumab    Disposition: Mr. Orcutt has been treated with sotorasib/panitumumab on the COVID break study since October 2022.  The CEA is lower and his clinical status appears stable.  The restaging CT suggest progressive disease in the lungs and liver.  I discussed the CT findings and reviewed the images with Mr. Lia.  There has been a consistent decline in the CEA, he has an excellent performance status, and some of the lung nodules are stable.  I suspect he has disease progression, but this is not completely clear. I  will present his case at the GI tumor conference next week for further review of the imaging studies.  We discussed the risk and potential benefit of remaining on sotorasib/panitumumab.  He  understands continuation of the current treatment may lack clinical benefit.  He agrees to continue sotorasib/panitumumab.  He will return for an office visit in 2 weeks.  I will contact Dr. Harlow Asa with an update regarding  Betsy Coder, MD  07/23/2021  4:50 PM

## 2021-07-23 NOTE — Progress Notes (Signed)
TRIAL: [place] / A Phase 3 Multicenter, Randomized, Openlabel, Active-controlled Study of Sotorasib and Panitumumab Versus Investigators Choice (Trifluridine and Tipiracil, or Regorafenib) for the Treatment of Previously Treated Metastatic Colorectal Cancer Subjects with KRAS p.G12C Mutation.   Cycle 5/Day 1: 07/22/2021  labs @ [facility] completed this morning.  Patient presented alone to [facility] for morning lab appointment for the start of cycle 5, day 1.  PROs: ePROs completed. Research Specialist, [name] met with patient to complete the ePRO's for this cycle. Research labs collected per study protocol.    07/23/2021: MD Appointment at [facility]:  Patient presented to [facility], alone, for MD appointment. I met with patient in exam room.Patient reports to be doing well and denies having any new issues or problems since his last visit. Patient denies any issues or difficulties with self administration and documentation of sotorasib. Pre-treatment labs were drawn yesterday, patient confirms fasting, and have resulted for MD review. Patient states that he does have questions, regarding recent CT scan, for Dr. Benay Spice.  Both study bottles of Sotorasib returned to pharmacy for verification of return, Rx 2646-0, lot # [id number], 16 pills remaining with count verified by Pharmacist Roselind Messier. Patient confirms last dose of Sotorasib taken Monday evening (02/06) at approximately 6 PM. Patient denies any difficulty with drug self administration and documentation in electronic drug diary.   Patient and Dr. Benay Spice discussed most recent scan. Dr. Benay Spice reviewed with patient scan and the results showing signs of progression as well as improving CEA. It was explained to the patient that he can be treated beyond progression, that the study allows this if Dr. Benay Spice feels that he may still benefit clinically by continuing his treatment as well as if patient wishes to continue with treatment. It  was explained to the patient that there is a consent form for treatment beyond progression that he would need to review and sign. This nurse also explained to patient that the study has its own review process of scans and until we receive a confirmation of progression, we will proceed for now until otherwise noted, or he wishes to stop, or Dr. Benay Spice feels that patient should stop study treatment. Patient expressed that he has been feeling great, continues to work full time, energy level good and states that even his blood sugar has improved. Patient expressed his wish to continue with AMGEN study and study drug, after Dr. Benay Spice reviewed possible other options, for now. This nurse informed Dr. Benay Spice and patient that consent form to treat beyond progression required a tumor biopsy of a new lesion or a progressing lesion, should it be feasible in the MD's opinion. Per Dr. Benay Spice, he does not feel it is feasible due to the location of the progressing tumor and risks of biopsy. Per Dr. Benay Spice, he would continue to treat patient until the next scan and will assess at that time, unless study says otherwise. Patient gave his verbal understanding and agreement to this plan.    Consent: Continued Study Treatment with Sotorasib and Panitumumab After Disease Progression Informed Consent Form ICF V3.0 22Sep2022 Patient and I reviewed the consent form for the Continued Study Treatment with Sotorasib and Panitumumab After Disease Progression, Informed Consent Form ICF V3.0 22Sep2022, page by page. This nurse reviewed with patient that continuing to participate is voluntary and that patient may stop or withdraw from study at any point, the rational of treatment after disease progression, continued study treatment after disease progression procedures, reasons why treatment after progression  would stop, the safety- potential risks and discomforts, potential benefits, potential costs/reimbursements, confidentiality,  compensation for injury, and whom to contact about this study. This nurse stopped several times during consent to inquire with patient if he had any questions, patient voiced he did not. After completion of reviewing with patient this ICF, patient proceeded to sign and date consent form where indicated. Patient was provided a copy of his signed consent for his records.   Vital signs: collected and documented, as required per protocol.  H&P: MD assessed patient and reviewed any adverse events and labs. After MD's review of labs and patient assessment, MD cleared patient to start cycle 5 day 1 this morning. Patient continues with Gr1 hypomagnesemia and per MD, to receive magnesium infusion again. All other labs deemed non-clinically significant by MD.   Adverse Events/SAE's: Patient with no SAE's. Patient continues to report to be doing well and denies having any concerns. Patient states that he feels great and almost "normal." Patient with a stable rash to chest, back and face, which per MD, is drying out and improving. Hypomagnesemia continues at a Grade 1. See AE's below. Research: Sotorasib dispense 2 bottles dispensed to patient, both with 120 pills in each, RX 2711-0 , Lot # [id number], expiration of 01/13/2022.  ECOG: MD assessed, ECOG score of 0 today. Treatment: After patient signed consent form to treat beyond progression, patient was cleared for study treatment. Patient will be started on 4 gram of IV magnesium, followed with panitumumab, Lot # [id number], for both vials. I spoke with patient and re-educated him regarding the importance of staying within the required window of Sotorasib self administration, patient gave verbal understanding. Patient stated he had forgotten his pills at home, when out and about and why his administration was out of window.   Concomitant medications: patient states he takes metformin once a day now and only uses the hydrocortisone cream as needed. Patient denies any new  medication.  Plan: Patient gave verbal understanding to continue with daily sotorasib 960 mg and documentation of self administration in the study electronic medication diary.  Patient denies further questions today and was thanked for his time and continued support of study. I encouraged him to call Dr. Benay Spice or myself with questions or concerns. Day 15, Cycle 5 will be scheduled for labs, MD and treatment in two weeks time. Patient knows that I will contact him with updates and should anything change once the study has assessed his week 16 scans.  NOBORU BIDINGER [id number]  06/25/2021  Adverse Event Log CTCAE v5.0  Study/Protocol: AMGEN Codebreak Cycle: Cycle 4/Day 15 to start of Cycle 5/Day 1 H/O: HTN-Gr2; Proteinuria-Gr1; Glucosuria- Gr1; hyponatremia- Gr1; Creatinine, elevated- Gr1; ALT, elevated- Gr1; ALK Phosphatase, elevated-Gr2; direct bilirubin, elevated- Gr1; platelet, decreased- Gr1; Hypomagnesemia- Gr 1; Diabetes mellitus, type II; hypoalbuminemia, Gr2; total bilirubin, elevated- Gr1;   Event Grade Onset Date Resolved Date Related to Sotorasib Related to Panitumumab  Treatment Comments  Papulopustular rash Gr 2 04/09/21 ongoing no yes OTC hydrocortisone  Doxycycline started 04/01/21   hypomagnesemia Gr 1  04/29/21 ongoing no yes _0  IV magnesium IV magnesium Mag resulted today @ 1.5 Mag 05/14/21 @ 1.5 Mag 05/27/21 @ 1.5 Mag 06/11/21 @ 1.4 Mag 06/25/21 @ 1.5 Mag 07/09/21 @ 1.6 Mag 07/23/21 @ 1.4   Paronychia Gr 1 06/25/21 ongoing no yes none Identified 06/25/21  Mucositis oral- ulcer Gr 1 06/25/21 ongoing possibly possibly none Identified 06/25/21  [name], RN, BSN, [place] Clinical Research Nurse Lead 07/23/2021 9:25 AM

## 2021-07-23 NOTE — Progress Notes (Signed)
Patient presents for treatment. RN assessment completed along with the following:  Labs/vitals reviewed - Yes, and Per Merceda Elks, ok  to proceed with treatment with Mg level.  Pt to receive 4 g IV Mg.     Weight within 10% of previous measurement - Yes Informed consent completed and reflects current therapy/intent - Yes, on date 04/01/21             Provider progress note reviewed - Today's provider note is not yet available. I reviewed the most recent oncology provider progress note in chart dated 07/09/21. Treatment/Antibody/Supportive plan reviewed - Yes, and there are no adjustments needed for today's treatment. S&H and other orders reviewed - Yes, and 4 g IV mg to be given.  Previous treatment date reviewed - Yes, and the appropriate amount of time has elapsed between treatments. Clinic Hand Off Received from - Merceda Elks, RN  Patient to proceed with treatment.

## 2021-07-24 ENCOUNTER — Encounter: Payer: Self-pay | Admitting: Oncology

## 2021-07-31 ENCOUNTER — Other Ambulatory Visit: Payer: Self-pay

## 2021-07-31 NOTE — Progress Notes (Signed)
The proposed treatment discussed in conference is for discussion purpose only and is not a binding recommendation.  The patients have not been physically examined, or presented with their treatment options.  Therefore, final treatment plans cannot be decided.  

## 2021-08-01 ENCOUNTER — Other Ambulatory Visit: Payer: Self-pay | Admitting: Medical Oncology

## 2021-08-01 DIAGNOSIS — C187 Malignant neoplasm of sigmoid colon: Secondary | ICD-10-CM

## 2021-08-04 ENCOUNTER — Other Ambulatory Visit: Payer: Self-pay

## 2021-08-04 ENCOUNTER — Other Ambulatory Visit: Payer: Self-pay | Admitting: Oncology

## 2021-08-04 ENCOUNTER — Inpatient Hospital Stay (HOSPITAL_BASED_OUTPATIENT_CLINIC_OR_DEPARTMENT_OTHER)
Admission: EM | Admit: 2021-08-04 | Discharge: 2021-08-07 | DRG: 948 | Disposition: A | Discharge status: Home / Self Care | Payer: Managed Care, Other (non HMO) | Attending: Internal Medicine | Admitting: Internal Medicine

## 2021-08-04 ENCOUNTER — Encounter (HOSPITAL_BASED_OUTPATIENT_CLINIC_OR_DEPARTMENT_OTHER): Payer: Self-pay | Admitting: Urology

## 2021-08-04 ENCOUNTER — Emergency Department (HOSPITAL_BASED_OUTPATIENT_CLINIC_OR_DEPARTMENT_OTHER): Payer: Managed Care, Other (non HMO)

## 2021-08-04 DIAGNOSIS — C787 Secondary malignant neoplasm of liver and intrahepatic bile duct: Secondary | ICD-10-CM | POA: Diagnosis present

## 2021-08-04 DIAGNOSIS — C7800 Secondary malignant neoplasm of unspecified lung: Secondary | ICD-10-CM | POA: Diagnosis present

## 2021-08-04 DIAGNOSIS — E1165 Type 2 diabetes mellitus with hyperglycemia: Secondary | ICD-10-CM | POA: Diagnosis present

## 2021-08-04 DIAGNOSIS — G893 Neoplasm related pain (acute) (chronic): Secondary | ICD-10-CM | POA: Diagnosis not present

## 2021-08-04 DIAGNOSIS — T451X5A Adverse effect of antineoplastic and immunosuppressive drugs, initial encounter: Secondary | ICD-10-CM | POA: Diagnosis present

## 2021-08-04 DIAGNOSIS — D696 Thrombocytopenia, unspecified: Secondary | ICD-10-CM | POA: Diagnosis present

## 2021-08-04 DIAGNOSIS — Z794 Long term (current) use of insulin: Secondary | ICD-10-CM

## 2021-08-04 DIAGNOSIS — C799 Secondary malignant neoplasm of unspecified site: Secondary | ICD-10-CM

## 2021-08-04 DIAGNOSIS — R52 Pain, unspecified: Secondary | ICD-10-CM | POA: Diagnosis present

## 2021-08-04 DIAGNOSIS — Z86718 Personal history of other venous thrombosis and embolism: Secondary | ICD-10-CM

## 2021-08-04 DIAGNOSIS — M545 Low back pain, unspecified: Secondary | ICD-10-CM | POA: Diagnosis present

## 2021-08-04 DIAGNOSIS — E119 Type 2 diabetes mellitus without complications: Secondary | ICD-10-CM

## 2021-08-04 DIAGNOSIS — M549 Dorsalgia, unspecified: Secondary | ICD-10-CM

## 2021-08-04 DIAGNOSIS — Z7984 Long term (current) use of oral hypoglycemic drugs: Secondary | ICD-10-CM

## 2021-08-04 DIAGNOSIS — Z888 Allergy status to other drugs, medicaments and biological substances status: Secondary | ICD-10-CM

## 2021-08-04 DIAGNOSIS — I10 Essential (primary) hypertension: Secondary | ICD-10-CM | POA: Diagnosis present

## 2021-08-04 DIAGNOSIS — Z79899 Other long term (current) drug therapy: Secondary | ICD-10-CM

## 2021-08-04 DIAGNOSIS — Z006 Encounter for examination for normal comparison and control in clinical research program: Secondary | ICD-10-CM

## 2021-08-04 DIAGNOSIS — C187 Malignant neoplasm of sigmoid colon: Secondary | ICD-10-CM | POA: Diagnosis present

## 2021-08-04 DIAGNOSIS — R21 Rash and other nonspecific skin eruption: Secondary | ICD-10-CM | POA: Diagnosis present

## 2021-08-04 DIAGNOSIS — Z20822 Contact with and (suspected) exposure to covid-19: Secondary | ICD-10-CM | POA: Diagnosis present

## 2021-08-04 HISTORY — DX: Malignant neoplasm of colon, unspecified: C18.9

## 2021-08-04 LAB — BASIC METABOLIC PANEL
Anion gap: 9 (ref 5–15)
BUN: 16 mg/dL (ref 6–20)
CO2: 21 mmol/L — ABNORMAL LOW (ref 22–32)
Calcium: 8.8 mg/dL — ABNORMAL LOW (ref 8.9–10.3)
Chloride: 104 mmol/L (ref 98–111)
Creatinine, Ser: 1.37 mg/dL — ABNORMAL HIGH (ref 0.61–1.24)
GFR, Estimated: >60 mL/min (ref >60)
Glucose, Bld: 251 mg/dL — ABNORMAL HIGH (ref 70–99)
Potassium: 4.1 mmol/L (ref 3.5–5.1)
Sodium: 134 mmol/L — ABNORMAL LOW (ref 135–145)

## 2021-08-04 LAB — CBC WITH DIFFERENTIAL/PLATELET
Abs Immature Granulocytes: 0.08 10*3/uL — ABNORMAL HIGH (ref 0.00–0.07)
Basophils Absolute: 0.1 10*3/uL (ref 0.0–0.1)
Basophils Relative: 1 %
Eosinophils Absolute: 0.3 10*3/uL (ref 0.0–0.5)
Eosinophils Relative: 3 %
HCT: 44.6 % (ref 39.0–52.0)
Hemoglobin: 14.8 g/dL (ref 13.0–17.0)
Immature Granulocytes: 1 %
Lymphocytes Relative: 19 %
Lymphs Abs: 2 10*3/uL (ref 0.7–4.0)
MCH: 27.9 pg (ref 26.0–34.0)
MCHC: 33.2 g/dL (ref 30.0–36.0)
MCV: 84.2 fL (ref 80.0–100.0)
Monocytes Absolute: 1 10*3/uL (ref 0.1–1.0)
Monocytes Relative: 10 %
Neutro Abs: 6.8 10*3/uL (ref 1.7–7.7)
Neutrophils Relative %: 66 %
Platelets: 159 10*3/uL (ref 150–400)
RBC: 5.3 MIL/uL (ref 4.22–5.81)
RDW: 13.9 % (ref 11.5–15.5)
WBC: 10.2 10*3/uL (ref 4.0–10.5)
nRBC: 0 % (ref 0.0–0.2)

## 2021-08-04 LAB — HEPATIC FUNCTION PANEL
ALT: 18 U/L (ref 0–44)
AST: 17 U/L (ref 15–41)
Albumin: 3.3 g/dL — ABNORMAL LOW (ref 3.5–5.0)
Alkaline Phosphatase: 184 U/L — ABNORMAL HIGH (ref 38–126)
Bilirubin, Direct: 0.7 mg/dL — ABNORMAL HIGH (ref 0.0–0.2)
Indirect Bilirubin: 0.9 mg/dL (ref 0.3–0.9)
Total Bilirubin: 1.6 mg/dL — ABNORMAL HIGH (ref 0.3–1.2)
Total Protein: 6.7 g/dL (ref 6.5–8.1)

## 2021-08-04 LAB — TROPONIN I (HIGH SENSITIVITY)
Troponin I (High Sensitivity): 3 ng/L (ref <18)
Troponin I (High Sensitivity): 3 ng/L (ref <18)

## 2021-08-04 LAB — LIPASE, BLOOD: Lipase: 26 U/L (ref 11–51)

## 2021-08-04 MED ORDER — HYDROMORPHONE HCL 1 MG/ML IJ SOLN
1.0000 mg | Freq: Once | INTRAMUSCULAR | Status: AC
  Administered 2021-08-05: 1 mg via INTRAVENOUS
  Filled 2021-08-04: qty 1

## 2021-08-04 MED ORDER — ONDANSETRON HCL 4 MG/2ML IJ SOLN
4.0000 mg | Freq: Once | INTRAMUSCULAR | Status: AC
  Administered 2021-08-04: 4 mg via INTRAVENOUS
  Filled 2021-08-04: qty 2

## 2021-08-04 MED ORDER — IOHEXOL 350 MG/ML SOLN
100.0000 mL | Freq: Once | INTRAVENOUS | Status: AC | PRN
  Administered 2021-08-04: 100 mL via INTRAVENOUS

## 2021-08-04 MED ORDER — SODIUM CHLORIDE 0.9 % IV BOLUS
1000.0000 mL | Freq: Once | INTRAVENOUS | Status: AC
Start: 2021-08-04 — End: 2021-08-05
  Administered 2021-08-04: 1000 mL via INTRAVENOUS

## 2021-08-04 MED ORDER — SODIUM CHLORIDE 0.9 % IV BOLUS
1000.0000 mL | Freq: Once | INTRAVENOUS | Status: AC
  Administered 2021-08-05: 1000 mL via INTRAVENOUS

## 2021-08-04 MED ORDER — HYDROMORPHONE HCL 1 MG/ML IJ SOLN
1.0000 mg | Freq: Once | INTRAMUSCULAR | Status: AC
  Administered 2021-08-04: 1 mg via INTRAVENOUS
  Filled 2021-08-04: qty 1

## 2021-08-04 NOTE — ED Notes (Signed)
Patient taken to CT at this time.

## 2021-08-04 NOTE — ED Triage Notes (Signed)
Starting thi afternoon had sharp pain in back behind shoulder Dan Bell, radiates down to buttocks and legs, pain with walking.  Stage 4 colon CA spread to Liver and Lungs On clinical trial with Dr. Malachy Mood at this time States SOB with pain  Respiratory in room at time of triage

## 2021-08-04 NOTE — ED Provider Notes (Signed)
[facility]ENC[facility] DEPARTMENT Provider Note   CSN: [id number] Arrival date & time: 08/04/21  2019     History  Chief Complaint  Patient presents with   Back Pain   Shortness of Breath    DEZMIN KITTELSON is a 51 y.o. male history of known stage IV colon cancer here presenting with back pain and shortness of breath.  Patient states that he has sudden onset of chest pain and back pain may radiate to his legs today.  He states that this is unusual for him.  He states that he has stage IV colon cancer and is on experimental treatment and follows up with Dr. Benay Spice  The history is provided by the patient.      Home Medications Prior to Admission medications   Medication Sig Start Date End Date Taking? Authorizing Provider  B-D UF III MINI PEN NEEDLES 31G X 5 MM MISC Inject into the skin. 02/20/21   [provider]  Continuous Blood Gluc Sensor (FREESTYLE LIBRE 14 DAY SENSOR) MISC SMARTSIG:1 Each Topical Every 2 Weeks 03/18/21   [provider]  doxycycline (VIBRAMYCIN) 100 MG capsule Take 1 capsule (100 mg total) by mouth 2 (two) times daily. 04/01/21   Ladell Pier, MD  hydrocortisone cream 1 % Apply 1 application topically as needed. 04/15/21   Ladell Pier, MD  Insulin Glargine (BASAGLAR KWIKPEN) 100 UNIT/ML Inject 40 Units into the skin daily. 01/23/21   [provider]  insulin lispro (HUMALOG) 100 UNIT/ML KwikPen Inject into the skin as needed. 11/27/20   [provider]  Insulin Pen Needle (B-D UF III MINI PEN NEEDLES) 31G X 5 MM MISC USE TO GIVE INSULIN THREE TIMES DAILY 07/24/20   [provider]  Insulin Pen Needle (PEN NEEDLES 31GX5/16") 31G X 8 MM MISC Needles for Automatic Data 09/07/19   [provider]  Investigational Sotorasib, AMGEN [id number], 120 MG tablet Take 8 tablets (960 mg total) by mouth daily. Take sotorasib dose (all tablets at the same time) with or without food at approximately the same  time every day. The sotorasib dose should not be taken more than 2 hours earlier than the scheduled time. The sotorasib dose should not be taken more than 6 hours after the dosing time. 06/25/21   Ladell Pier, MD  metFORMIN (GLUCOPHAGE) 1000 MG tablet Take 1,000 mg by mouth 2 (two) times daily. 01/24/21   [provider]  OLANZapine (ZYPREXA) 5 MG tablet Take 5 mg by mouth at bedtime. 05/28/21   [provider]  prochlorperazine (COMPAZINE) 10 MG tablet Take by mouth. Patient not taking: Reported on 05/28/2021 11/16/19   [provider]      Allergies    Empagliflozin, Fosaprepitant, and Oxaliplatin    Review of Systems   Review of Systems  Respiratory:  Positive for shortness of breath.   Musculoskeletal:  Positive for back pain.  All other systems reviewed and are negative.  Physical Exam Updated Vital Signs BP (!) 131/107    Pulse (!) 123    Temp 98.6 F (37 C) (Oral)    Resp 16    Ht 5\' 10"  (1.778 m)    Wt 104.3 kg    SpO2 98%    BMI 33.00 kg/m  Physical Exam Vitals and nursing note reviewed.  Constitutional:      Comments: Uncomfortable, chronically ill.  HENT:     Head: Normocephalic.     Mouth/Throat:     Mouth:  Mucous membranes are moist.  Eyes:     Extraocular Movements: Extraocular movements intact.     Pupils: Pupils are equal, round, and reactive to light.  Cardiovascular:     Rate and Rhythm: Regular rhythm. Tachycardia present.  Pulmonary:     Effort: Pulmonary effort is normal.     Breath sounds: Normal breath sounds.  Abdominal:     General: Bowel sounds are normal.     Palpations: Abdomen is soft.  Musculoskeletal:        General: Normal range of motion.     Cervical back: Normal range of motion and neck supple.  Skin:    General: Skin is warm.     Capillary Refill: Capillary refill takes less than 2 seconds.  Neurological:     General: No focal deficit present.     Mental Status: He is oriented to person, place, and time.   Psychiatric:        Mood and Affect: Mood normal.        Behavior: Behavior normal.    ED Results / Procedures / Treatments   Labs (all labs ordered are listed, but only abnormal results are displayed) Labs Reviewed  CBC WITH DIFFERENTIAL/PLATELET - Abnormal; Notable for the following components:      Result Value   Abs Immature Granulocytes 0.08 (*)    All other components within normal limits  BASIC METABOLIC PANEL - Abnormal; Notable for the following components:   Sodium 134 (*)    CO2 21 (*)    Glucose, Bld 251 (*)    Creatinine, Ser 1.37 (*)    Calcium 8.8 (*)    All other components within normal limits  HEPATIC FUNCTION PANEL  LIPASE, BLOOD  TROPONIN I (HIGH SENSITIVITY)  TROPONIN I (HIGH SENSITIVITY)    EKG None  Radiology DG Chest 2 View  Result Date: 08/04/2021 CLINICAL DATA:  Sharp back pain behind shoulder blades. EXAM: CHEST - 2 VIEW COMPARISON:  Chest plain film, dated February 28, 2007 and chest CT, dated July 16, 2021. FINDINGS: A right-sided venous Port-A-Cath is seen with its distal tip overlying the junction of the superior vena cava and right atrium. A 21 mm x 16 mm well-defined radiopaque lung nodule is seen overlying the mid right lung. A 9 mm diameter lung nodule is also seen overlying the medial aspect of the right upper lobe. These correspond to lung nodules seen within the right lung on the prior chest CT. There is no evidence of acute infiltrate, pleural effusion or pneumothorax. The heart size and mediastinal contours are within normal limits. The visualized skeletal structures are unremarkable. IMPRESSION: 1. Lung nodules within the mid and upper right lung, as described above, consistent with the patient's known history of pulmonary metastasis. 2. No acute infiltrate. Electronically Signed   By: Virgina Norfolk M.D.   On: 08/04/2021 21:18    Procedures Procedures    Medications Ordered in ED Medications  sodium chloride 0.9 % bolus 1,000  mL (has no administration in time range)  HYDROmorphone (DILAUDID) injection 1 mg (1 mg Intravenous Given 08/04/21 2259)  ondansetron (ZOFRAN) injection 4 mg (4 mg Intravenous Given 08/04/21 2259)  sodium chloride 0.9 % bolus 1,000 mL (1,000 mLs Intravenous New Bag/Given 08/04/21 2256)  iohexol (OMNIPAQUE) 350 MG/ML injection 100 mL (100 mLs Intravenous Contrast Given 08/04/21 2305)    ED Course/ Medical Decision Making/ A&P  Medical Decision Making OLDEN KLAUER is a 51 y.o. male here presenting with chest pain and back pain and leg pain.  This is a cute onset.  Patient is tachycardic and hypertensive.  Concern for possible dissection.  Also consider PE as well.  Plan to get dissection study.  We will get CBC and CMP and lipase and troponin as well.  We will give pain medicine and fluids.  Patient will likely need admission for pain control  11:28 PM CBC and BMP unremarkable.  Dissection study is pending.  Signed out to Dr. Nicholes Stairs in the ED. Anticipate admission for pain control for metastatic cancer    Amount and/or Complexity of Data Reviewed Labs: ordered. Radiology: ordered.  Risk Prescription drug management. Decision regarding hospitalization.   Final Clinical Impression(s) / ED Diagnoses Final diagnoses:  None    Rx / DC Orders ED Discharge Orders     None         Drenda Freeze, MD 08/04/21 2342

## 2021-08-05 ENCOUNTER — Telehealth: Payer: Self-pay | Admitting: Medical Oncology

## 2021-08-05 ENCOUNTER — Encounter: Payer: Self-pay | Admitting: Oncology

## 2021-08-05 ENCOUNTER — Inpatient Hospital Stay: Payer: Managed Care, Other (non HMO)

## 2021-08-05 DIAGNOSIS — R21 Rash and other nonspecific skin eruption: Secondary | ICD-10-CM | POA: Diagnosis not present

## 2021-08-05 DIAGNOSIS — C7800 Secondary malignant neoplasm of unspecified lung: Secondary | ICD-10-CM | POA: Diagnosis present

## 2021-08-05 DIAGNOSIS — Z20822 Contact with and (suspected) exposure to covid-19: Secondary | ICD-10-CM | POA: Diagnosis not present

## 2021-08-05 DIAGNOSIS — I10 Essential (primary) hypertension: Secondary | ICD-10-CM | POA: Diagnosis not present

## 2021-08-05 DIAGNOSIS — D696 Thrombocytopenia, unspecified: Secondary | ICD-10-CM | POA: Diagnosis not present

## 2021-08-05 DIAGNOSIS — C7802 Secondary malignant neoplasm of left lung: Secondary | ICD-10-CM

## 2021-08-05 DIAGNOSIS — C187 Malignant neoplasm of sigmoid colon: Secondary | ICD-10-CM | POA: Diagnosis not present

## 2021-08-05 DIAGNOSIS — E119 Type 2 diabetes mellitus without complications: Secondary | ICD-10-CM

## 2021-08-05 DIAGNOSIS — T451X5A Adverse effect of antineoplastic and immunosuppressive drugs, initial encounter: Secondary | ICD-10-CM | POA: Diagnosis not present

## 2021-08-05 DIAGNOSIS — C7801 Secondary malignant neoplasm of right lung: Secondary | ICD-10-CM

## 2021-08-05 DIAGNOSIS — M549 Dorsalgia, unspecified: Secondary | ICD-10-CM

## 2021-08-05 DIAGNOSIS — Z888 Allergy status to other drugs, medicaments and biological substances status: Secondary | ICD-10-CM | POA: Diagnosis not present

## 2021-08-05 DIAGNOSIS — Z7984 Long term (current) use of oral hypoglycemic drugs: Secondary | ICD-10-CM | POA: Diagnosis not present

## 2021-08-05 DIAGNOSIS — C787 Secondary malignant neoplasm of liver and intrahepatic bile duct: Secondary | ICD-10-CM | POA: Diagnosis not present

## 2021-08-05 DIAGNOSIS — M546 Pain in thoracic spine: Secondary | ICD-10-CM

## 2021-08-05 DIAGNOSIS — Z794 Long term (current) use of insulin: Secondary | ICD-10-CM | POA: Diagnosis not present

## 2021-08-05 DIAGNOSIS — Z86718 Personal history of other venous thrombosis and embolism: Secondary | ICD-10-CM | POA: Diagnosis not present

## 2021-08-05 DIAGNOSIS — Z006 Encounter for examination for normal comparison and control in clinical research program: Secondary | ICD-10-CM | POA: Diagnosis not present

## 2021-08-05 DIAGNOSIS — Z79899 Other long term (current) drug therapy: Secondary | ICD-10-CM | POA: Diagnosis not present

## 2021-08-05 DIAGNOSIS — M545 Low back pain, unspecified: Secondary | ICD-10-CM | POA: Diagnosis not present

## 2021-08-05 DIAGNOSIS — G893 Neoplasm related pain (acute) (chronic): Secondary | ICD-10-CM | POA: Diagnosis not present

## 2021-08-05 DIAGNOSIS — E1165 Type 2 diabetes mellitus with hyperglycemia: Secondary | ICD-10-CM | POA: Diagnosis not present

## 2021-08-05 LAB — URINALYSIS, MICROSCOPIC (REFLEX)

## 2021-08-05 LAB — COMPREHENSIVE METABOLIC PANEL
ALT: 17 U/L (ref 0–44)
AST: 18 U/L (ref 15–41)
Albumin: 3.2 g/dL — ABNORMAL LOW (ref 3.5–5.0)
Alkaline Phosphatase: 161 U/L — ABNORMAL HIGH (ref 38–126)
Anion gap: 7 (ref 5–15)
BUN: 19 mg/dL (ref 6–20)
CO2: 23 mmol/L (ref 22–32)
Calcium: 8.6 mg/dL — ABNORMAL LOW (ref 8.9–10.3)
Chloride: 106 mmol/L (ref 98–111)
Creatinine, Ser: 1.23 mg/dL (ref 0.61–1.24)
GFR, Estimated: >60 mL/min (ref >60)
Glucose, Bld: 184 mg/dL — ABNORMAL HIGH (ref 70–99)
Potassium: 4.2 mmol/L (ref 3.5–5.1)
Sodium: 136 mmol/L (ref 135–145)
Total Bilirubin: 1.1 mg/dL (ref 0.3–1.2)
Total Protein: 5.9 g/dL — ABNORMAL LOW (ref 6.5–8.1)

## 2021-08-05 LAB — URINALYSIS, ROUTINE W REFLEX MICROSCOPIC
Glucose, UA: 100 mg/dL — AB
Ketones, ur: NEGATIVE mg/dL
Leukocytes,Ua: NEGATIVE
Nitrite: NEGATIVE
Protein, ur: >=300 mg/dL — AB
Specific Gravity, Urine: 1.02 (ref 1.005–1.030)
pH: 5.5 (ref 5.0–8.0)

## 2021-08-05 LAB — RESP PANEL BY RT-PCR (FLU A&B, COVID) ARPGX2
Influenza A by PCR: NEGATIVE
Influenza B by PCR: NEGATIVE
SARS Coronavirus 2 by RT PCR: NEGATIVE

## 2021-08-05 LAB — GLUCOSE, CAPILLARY: Glucose-Capillary: 184 mg/dL — ABNORMAL HIGH (ref 70–99)

## 2021-08-05 LAB — HEMOGLOBIN A1C
Hgb A1c MFr Bld: 7.1 % — ABNORMAL HIGH (ref 4.8–5.6)
Mean Plasma Glucose: 157 mg/dL

## 2021-08-05 LAB — CBG MONITORING, ED
Glucose-Capillary: 227 mg/dL — ABNORMAL HIGH (ref 70–99)
Glucose-Capillary: 242 mg/dL — ABNORMAL HIGH (ref 70–99)

## 2021-08-05 MED ORDER — HYDROMORPHONE HCL 1 MG/ML IJ SOLN
1.0000 mg | Freq: Once | INTRAMUSCULAR | Status: AC
  Administered 2021-08-05: 1 mg via INTRAVENOUS
  Filled 2021-08-05: qty 1

## 2021-08-05 MED ORDER — KETOROLAC TROMETHAMINE 30 MG/ML IJ SOLN
15.0000 mg | Freq: Once | INTRAMUSCULAR | Status: AC
  Administered 2021-08-05: 15 mg via INTRAVENOUS
  Filled 2021-08-05: qty 1

## 2021-08-05 MED ORDER — HYDROMORPHONE HCL 1 MG/ML IJ SOLN
1.0000 mg | INTRAMUSCULAR | Status: DC | PRN
  Administered 2021-08-05 – 2021-08-06 (×3): 1 mg via INTRAVENOUS
  Filled 2021-08-05 (×3): qty 1

## 2021-08-05 MED ORDER — OXYCODONE HCL 5 MG PO TABS
10.0000 mg | ORAL_TABLET | ORAL | Status: DC | PRN
  Administered 2021-08-05 – 2021-08-07 (×6): 10 mg via ORAL
  Filled 2021-08-05 (×6): qty 2

## 2021-08-05 MED ORDER — PROCHLORPERAZINE MALEATE 10 MG PO TABS
10.0000 mg | ORAL_TABLET | Freq: Every day | ORAL | Status: DC | PRN
  Administered 2021-08-06: 10 mg via ORAL
  Filled 2021-08-05: qty 1

## 2021-08-05 MED ORDER — DOCUSATE SODIUM 100 MG PO CAPS
100.0000 mg | ORAL_CAPSULE | Freq: Two times a day (BID) | ORAL | Status: DC
  Administered 2021-08-05 – 2021-08-06 (×2): 100 mg via ORAL
  Filled 2021-08-05 (×2): qty 1

## 2021-08-05 MED ORDER — DOXYCYCLINE HYCLATE 100 MG PO TABS
100.0000 mg | ORAL_TABLET | Freq: Every morning | ORAL | Status: DC
  Administered 2021-08-06: 100 mg via ORAL
  Filled 2021-08-05: qty 1

## 2021-08-05 MED ORDER — INSULIN ASPART 100 UNIT/ML IJ SOLN
0.0000 [IU] | Freq: Every day | INTRAMUSCULAR | Status: DC
  Administered 2021-08-06: 2 [IU] via SUBCUTANEOUS

## 2021-08-05 MED ORDER — HYDRALAZINE HCL 20 MG/ML IJ SOLN: 10.0000 mg | Freq: Three times a day (TID) | INTRAMUSCULAR | Status: DC | PRN

## 2021-08-05 MED ORDER — INSULIN ASPART 100 UNIT/ML IJ SOLN
0.0000 [IU] | Freq: Three times a day (TID) | INTRAMUSCULAR | Status: DC
  Administered 2021-08-05 – 2021-08-06 (×2): 3 [IU] via SUBCUTANEOUS
  Administered 2021-08-06 (×2): 2 [IU] via SUBCUTANEOUS

## 2021-08-05 MED ORDER — ONDANSETRON HCL 4 MG/2ML IJ SOLN
4.0000 mg | Freq: Once | INTRAMUSCULAR | Status: AC
  Administered 2021-08-05: 4 mg via INTRAVENOUS
  Filled 2021-08-05: qty 2

## 2021-08-05 MED ORDER — CYCLOBENZAPRINE HCL 5 MG PO TABS
5.0000 mg | ORAL_TABLET | Freq: Three times a day (TID) | ORAL | Status: DC | PRN
  Administered 2021-08-05 – 2021-08-07 (×4): 5 mg via ORAL
  Filled 2021-08-05 (×4): qty 1

## 2021-08-05 NOTE — Telephone Encounter (Signed)
TRIAL: [place] / A Phase 3 Multicenter, Randomized, Openlabel, Active-controlled Study of Sotorasib and Panitumumab Versus Investigators Choice (Trifluridine and Tipiracil, or Regorafenib) for the Treatment of Previously Treated Metastatic Colorectal Cancer Subjects with KRAS p.G12C Mutation.  Outgoing call: 0901 VM this morning from patient's wife informing me that patient was admitted last night at [facility]. Spouse state's patient admitted for "severe back pain and shortness of breath" and states that they are waiting on a free bed to be transferred to [facility]. I returned spouse's call this morning and LVMOM with spouse letting her know that I had received her message and I have informed Dr. Benay Spice of patient's admission. I encouraged her to call me or Dr. Gearldine Shown office with questions or concerns.   Outgoing call: [phone] [facility] with patient letting him know that I had received his wife's message. I informed patient that Dr. Benay Spice has been made aware that he has been admitted and per Dr. Benay Spice, if patient has not stopped study drug yesterday, patient is to stop study drug Sotorasib at this time. Patient was encouraged to call Dr. Benay Spice or myself.  Incoming call: [phone] Return call from patient informing me he received my vm. Patient states he took his last dose of Sotorasib last night, as usual, and verbally confirms that he will not take anymore study drug going forward. Patient states his back pain continues and occurs when moving. Patient states as long as he isn't walking, pain is controlled. Patient reports to still be waiting on a bed at [facility] to be transferred here. I thanked patient for returning my call and encouraged him to call me should he have any questions, concerns or needs.   [name], RN, BSN, [place] Clinical Research Nurse Lead 08/05/2021 9:46 AM

## 2021-08-05 NOTE — ED Notes (Signed)
Patient c/o increased pain and nausea after ambulating to bathroom

## 2021-08-05 NOTE — Progress Notes (Signed)
IP PROGRESS NOTE  Subjective:   Dan Bell is well-known to me with a history of metastatic colon cancer.  He is currently being treated with sotorasib and panitumumab. He reports the onset of severe pain at the mid upper back to the right of midline beginning yesterday.  The pain worsens when he stands up and radiates to the lower back and legs.  He has a cough and dyspnea.  No fever.  No leg numbness or weakness.  No difficulty with bowel or bladder function. He presented to the emergency room yesterday for further evaluation.  He reports the pain is tolerable while he is still, but he is unable to ambulate.  A CT angiogram did not reveal an acute abnormality.  Liver metastases appear larger.  Objective: Vital signs in last 24 hours: Blood pressure (!) 130/95, pulse 81, temperature 97.8 F (36.6 C), temperature source Oral, resp. rate 16, height '5\' 10"'  (1.778 m), weight 230 lb (104.3 kg), SpO2 97 %.  Intake/Output from previous day: 02/19 0701 - 02/20 0700 In: 2000 [IV Piggyback:2000] Out: -   Physical Exam:  Lungs: Decreased breath sounds with coarse inspiratory rhonchi at the lower posterior chest bilaterally, no respiratory distress Cardiac: Regular rate and rhythm Abdomen: Nontender, no hepatosplenomegaly, no mass Extremities: No leg edema Neurologic: Alert and oriented, the motor exam appears intact in the legs and feet bilaterally Skin: Acne type rash over the trunk  Portacath/PICC-without erythema  Lab Results: Recent Labs    08/04/21 2040  WBC 10.2  HGB 14.8  HCT 44.6  PLT 159    BMET Recent Labs    08/04/21 2040 08/05/21 1547  NA 134* 136  K 4.1 4.2  CL 104 106  CO2 21* 23  GLUCOSE 251* 184*  BUN 16 19  CREATININE 1.37* 1.23  CALCIUM 8.8* 8.6*    Lab Results  Component Value Date   CEA1 7.53 (H) 07/22/2021    Studies/Results: DG Chest 2 View  Result Date: 08/04/2021 CLINICAL DATA:  Sharp back pain behind shoulder blades. EXAM: CHEST - 2  VIEW COMPARISON:  Chest plain film, dated February 28, 2007 and chest CT, dated July 16, 2021. FINDINGS: A right-sided venous Port-A-Cath is seen with its distal tip overlying the junction of the superior vena cava and right atrium. A 21 mm x 16 mm well-defined radiopaque lung nodule is seen overlying the mid right lung. A 9 mm diameter lung nodule is also seen overlying the medial aspect of the right upper lobe. These correspond to lung nodules seen within the right lung on the prior chest CT. There is no evidence of acute infiltrate, pleural effusion or pneumothorax. The heart size and mediastinal contours are within normal limits. The visualized skeletal structures are unremarkable. IMPRESSION: 1. Lung nodules within the mid and upper right lung, as described above, consistent with the patient's known history of pulmonary metastasis. 2. No acute infiltrate. Electronically Signed   By: Virgina Norfolk M.D.   On: 08/04/2021 21:18   CT Angio Chest/Abd/Pel for Dissection W and/or Wo Contrast  Result Date: 08/04/2021 CLINICAL DATA:  Acute aortic syndrome (AAS) suspected Acute onset of back pain. Patient with history of stage IV colon cancer. EXAM: CT ANGIOGRAPHY CHEST, ABDOMEN AND PELVIS TECHNIQUE: Non-contrast CT of the chest was initially obtained. Multidetector CT imaging through the chest, abdomen and pelvis was performed using the standard protocol during bolus administration of intravenous contrast. Multiplanar reconstructed images and MIPs were obtained and reviewed to evaluate the vascular anatomy. RADIATION  DOSE REDUCTION: This exam was performed according to the departmental dose-optimization program which includes automated exposure control, adjustment of the mA and/or kV according to patient size and/or use of iterative reconstruction technique. CONTRAST:  118m OMNIPAQUE IOHEXOL 350 MG/ML SOLN COMPARISON:  Chest radiograph earlier today. Recent staging abdominopelvic CT 07/16/2021 FINDINGS: CTA  CHEST FINDINGS Cardiovascular: The thoracic aorta is normal in caliber. No aortic dissection, aneurysm, acute aortic syndrome. No aortic hematoma noncontrast exam. No significant atherosclerosis. No periaortic stranding. Right chest port in place. Exam not tailored for evaluation of pulmonary arteries, there is minimal opacification of the pulmonary arteries on the current exam. Heart is normal in size. Mediastinum/Nodes: 2 cm enlarged right hilar lymph node is new from prior exam. There is small additional mediastinal lymph nodes are not enlarged by size criteria. Patulous distal esophagus. Lungs/Pleura: Pulmonary metastatic disease with multiple pulmonary nodules. Right lower lobe nodule measures 3.5 x 3.7 cm, series 6, image 38, previously 2.8 x 2.7 cm. Paramediastinal left upper lobe nodule measures 2.8 x 2.3 cm, series 6, image 33, previously 2.2 x 1.7 cm. Anterior right upper lobe nodule measures 1.9 x 2 cm, series 6, image 26, previously 1.7 x 1.4 cm. Many of the additional pulmonary nodules are also larger than on prior exam. No airspace consolidation. Trace pleural thickening without significant effusion. Musculoskeletal: No lytic or blastic osseous lesions. No acute osseous findings to explain pain. No subcutaneous soft tissue abnormalities. Review of the MIP images confirms the above findings. CTA ABDOMEN AND PELVIS FINDINGS VASCULAR Aorta: Normal caliber aorta without aneurysm, dissection, vasculitis or significant stenosis. Mild calcified atherosclerosis. Celiac: Patent without evidence of aneurysm, dissection, vasculitis or significant stenosis. SMA: Patent without evidence of aneurysm, dissection, vasculitis or significant stenosis. Renals: 2 right renal arteries with small artery supplying the upper pole. Single left renal artery with early branching. All renal arteries are patent IMA: Patent. Inflow: Patent without evidence of aneurysm, dissection, vasculitis or significant stenosis. Veins: Not well  assessed on this arterial phase exam. Review of the MIP images confirms the above findings. NON-VASCULAR Hepatobiliary: Patient's known hepatic metastatic disease is not well assessed on this arterial phase exam. Confluent posterior right lobe liver lesion measures approximately 11.7 x 10.1 cm, unchanged from prior exam. Confluent lesion in the inferior right hepatic lobe is difficult to measure. Many of the additional liver lesions are not well-defined on this arterial phase exam. Gallbladder is partially distended. There is no gallstone or gallbladder wall thickening, however there is mild no common bile duct dilatation. Stranding of the pericholecystic fat. Pancreas: No ductal dilatation or inflammation. No evidence of pancreatic mass. Spleen: Splenomegaly is again seen. The spleen measures 17.9 x 6.0 x 10.8 cm (volume = 610 cm^3), previous splenic volume was 943 cc. Normal splenic arterial enhancement. Adrenals/Urinary Tract: No adrenal nodules. There is no hydronephrosis. Moderate bilateral perinephric edema, new in the interim. Small amount of fluid tracks into the anterior pararenal spaces and retroperitoneum. No visualized renal calculi. Urinary bladder is decompressed, no obvious bladder wall thickening. Stomach/Bowel: Lack of enteric contrast and arterial phase imaging limits detailed assessment. May be a tiny hiatal hernia. The stomach is decompressed. There is no small bowel obstruction or evidence of small bowel inflammation. Normal appendix. Small to moderate colonic stool burden. No colonic wall thickening or colonic inflammation. Lymphatic: No enlarged abdominopelvic lymph nodes. Reproductive: No acute findings. Other: No ascites. No evidence of omental thickening. There is a tiny fat containing umbilical hernia. Musculoskeletal: No lytic or blastic osseous  lesions. No acute osseous findings. Review of the MIP images confirms the above findings. IMPRESSION: 1. No aortic dissection or acute aortic  abnormality. 2. Progression in thoracic metastatic disease with increased size of multiple bilateral pulmonary nodules. There is a new enlarged right hilar lymph node. 3. Patient's known hepatic metastatic disease is less well-defined on this arterial phase exam, confluent hepatic metastasis are grossly similar. 4. Moderate bilateral perinephric edema with trace fluid tracking into the anterior pararenal spaces and retroperitoneum. This is nonspecific, and there are no specific findings of pyelonephritis on this arterial phase exam. 5. Splenomegaly. Electronically Signed   By: Keith Rake M.D.   On: 08/04/2021 23:38    Medications: I have reviewed the patient's current medications.  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 and CDX2 positive FOLFOX plus bevacizumab, cycle 1 08/24/2019, 11 total cycles, oxaliplatin held with cycle 11 secondary to a reaction with cycle 10 FOLFOX plus bevacizumab discontinued after cycle 19 secondary to disease progression FOLFIRI plus bevacizumab beginning 06/20/2020, status post 12 cycles Disease progression on CT 02/11/2021-enlarging lung nodules and liver lesions CEA increased 02/13/2021 CTs 03/11/2021-bulky hypodense liver metastases, slightly progressive, enlargement of bilateral lung nodules, slight enlargement of mediastinal nodes, splenomegaly and gastroesophageal varices Sotorasib/panitumumab on the Code[employer] study beginning 04/01/2021 CTs 05/21/2021-decrease size of lung and liver metastases, no new lesions CTs 07/16/2021-new and  enlarged lung lesions, increased size of liver lesions, no new lesions, no new site of metastatic disease Sotorasib/panitumumab continued to 07/23/2021     Type 2 diabetes Left lower extremity DVT 2018 Oxaliplatin reaction with cycle 10 Skin rash most likely related to Panitumumab, grade 2 04/15/2021 Thrombocytopenia Hypomagnesemia secondary to panitumumab Admission 08/05/2021 with new onset severe back pain    Dan Bell has metastatic colon cancer.  There is evidence of disease progression in the lungs on the CT today.  I recommend discontinuing sotorasib and panitumumab.  The etiology of the acute pain is unclear.  I reviewed the admission CT images.  I do not appreciate bone lesions or pleural disease to explain the pain.  The pain could be related to hematuria noted on the admission urinalysis.  We will need to consider a bone scan or MRI of the spine if the pain persist.  His ECOG performance status is measured at 1-2 today. Recommendations:  Discontinue sotorasib and panitumumab Narcotic analgesics as needed for pain Consider a bone scan or MRI of the spine for persistent severe back pain. 4.   Further evaluation of urinary tract for persistent hematuria.  LOS: 0 days   Betsy Coder, MD   08/05/2021, 6:00 PM

## 2021-08-05 NOTE — ED Notes (Signed)
Pt asking for breakfast.  EDP made aware and states he can eat.  Pt denies any pain or nausea at this time.

## 2021-08-05 NOTE — Progress Notes (Addendum)
BS 186 at 10 pm per CNA jeremy

## 2021-08-05 NOTE — H&P (Signed)
History and Physical    Patient: Dan Bell RJJ:[id number] DOB: June 14, 1971 DOA: 08/04/2021 DOS: the patient was seen and examined on 08/05/2021 PCP: Crista Elliot, PA-C  Patient coming from: Home  Chief Complaint:  Chief Complaint  Patient presents with   Back Pain   Shortness of Breath    HPI: OZ GAMMEL is a 51 y.o. male with medical history significant of colon CA w/ liver/lung mets, DM2, HTN. Presenting with back pain and dyspnea. He woke up yesterday morning with pain behind his right shoulder blade. He initially dismissed it as having slept on his shoulder wrong. However, as the day progressed, his back pain progressed. It felt like "someone taking a screw driver and twisting it" in his back. It worsened to include his entire back and he started having problems taking deep breaths. He didn't try any medications. He became concerned and went to the ED for assistance.    Of note, he is on a clinical trial medication for his cancer w/ Dr. Benay Spice.   Review of Systems: As mentioned in the history of present illness. All other systems reviewed and are negative. Past Medical History:  Diagnosis Date   Colon cancer [facility])    History reviewed. No pertinent surgical history. Social History:  reports that he has never smoked. He has never been exposed to tobacco smoke. He has never used smokeless tobacco. He reports that he does not drink alcohol and does not use drugs.  Allergies  Allergen Reactions   Empagliflozin Other (See Comments)    GI upset   Fosaprepitant Other (See Comments)    Flushing   Oxaliplatin Other (See Comments)    Facial flush, short of breath    History reviewed. No pertinent family history.  Prior to Admission medications   Medication Sig Start Date End Date Taking? Authorizing Provider  acetaminophen (TYLENOL) 500 MG tablet Take 1,000 mg by mouth every 6 (six) hours as needed for headache (pain).   Yes [provider]   doxycycline (VIBRAMYCIN) 100 MG capsule Take 1 capsule (100 mg total) by mouth 2 (two) times daily. Patient taking differently: Take 100 mg by mouth every morning. 04/01/21  Yes Ladell Pier, MD  insulin lispro (HUMALOG) 100 UNIT/ML KwikPen Inject 5-14 Units into the skin 3 (three) times daily as needed (CBG >200). 11/27/20  Yes [provider]  metFORMIN (GLUCOPHAGE) 1000 MG tablet Take 1,000 mg by mouth 2 (two) times daily. 01/24/21  Yes [provider]  prochlorperazine (COMPAZINE) 10 MG tablet Take 10 mg by mouth daily as needed for nausea or vomiting. 11/16/19  Yes [provider]  B-D UF III MINI PEN NEEDLES 31G X 5 MM MISC Inject into the skin. 02/20/21   [provider]  Continuous Blood Gluc Sensor (FREESTYLE LIBRE 14 DAY SENSOR) MISC SMARTSIG:1 Each Topical Every 2 Weeks 03/18/21   [provider]  Insulin Glargine (BASAGLAR KWIKPEN) 100 UNIT/ML Inject 40 Units into the skin daily. Patient not taking: Reported on 08/05/2021 01/23/21   [provider]  Insulin Pen Needle (B-D UF III MINI PEN NEEDLES) 31G X 5 MM MISC USE TO GIVE INSULIN THREE TIMES DAILY 07/24/20   [provider]  Insulin Pen Needle (PEN NEEDLES 31GX5/16") 31G X 8 MM MISC Needles for Automatic Data 09/07/19   [provider]  Investigational Sotorasib, AMGEN [id number], 120 MG tablet Take 8 tablets (960 mg total) by mouth daily. Take sotorasib dose (all tablets at the same time) with  or without food at approximately the same time every day. The sotorasib dose should not be taken more than 2 hours earlier than the scheduled time. The sotorasib dose should not be taken more than 6 hours after the dosing time. Patient not taking: Reported on 08/05/2021 06/25/21   Ladell Pier, MD    Physical Exam: Vitals:   08/05/21 0930 08/05/21 1000 08/05/21 1200 08/05/21 1434  BP: 120/81 127/88 127/89 (!) 130/95  Pulse: 88 95 100 81  Resp: 20 16 (!) 21 16  Temp:     97.8 F (36.6 C)  TempSrc:    Oral  SpO2: 98% 99% 96% 97%  Weight:      Height:       General: 51 y.o. male resting in bed in NAD Eyes: PERRL, normal sclera ENMT: Nares patent w/o discharge, orophaynx clear, dentition normal, ears w/o discharge/lesions/ulcers Neck: Supple, trachea midline Cardiovascular: RRR, +S1, S2, no m/g/r, equal pulses throughout Respiratory: CTABL, no w/r/r, normal WOB GI: BS+, NDNT, no masses noted, no organomegaly noted MSK: No e/c/c; back pain w/ deep inspiration, slight spasm of left rhomboid Neuro: A&O x 3, no focal deficits Psyc: Appropriate interaction and affect, calm/cooperative  Data Reviewed:  Glucose 251 SCr 1.37 Alk Phos: 184 T bili 1.6 WBC 10.2  CTA chest/ab/pelvis: 1. No aortic dissection or acute aortic abnormality. 2. Progression in thoracic metastatic disease with increased size of multiple bilateral pulmonary nodules. There is a new enlarged right hilar lymph node. 3. Patient's known hepatic metastatic disease is less well-defined on this arterial phase exam, confluent hepatic metastasis are grossly similar. 4. Moderate bilateral perinephric edema with trace fluid tracking into the anterior pararenal spaces and retroperitoneum. This is nonspecific, and there are no specific findings of pyelonephritis on this arterial phase exam. 5. Splenomegaly.  Assessment and Plan: No notes have been filed under this hospital service. Service: Hospitalist Back pain     - placed in obs, tele     - mild spasm on exam, but not in the location that he was describing the onset of symptoms; will give PRN flexeril     - no MSK abnormalities on imaging     - likely that this pain is referred from increasing metastatic disease     - for now will have IV pain control     - I don't see anything that would require steroids or abx     - Onco onboard, will await final recs     - UA not c/w UTI/pyelo  Colon CA w/ mets to liver and lungs     - he is on an clinical  trial medication     - follows w/ Dr. Benay Spice; he is aware of his hospitalization and will be following the patient  DM2     - A1c, SSI, DM diet, glucose checks  HTN     - previous Dx, but not on medication at this time     - PRN hydralazine for now  Advance Care Planning:   Code Status: FULL  Consults: Onco (Dr. Benay Spice)  Family Communication: w/ wife at bedside  Severity of Illness: The appropriate patient status for this patient is OBSERVATION. Observation status is judged to be reasonable and necessary in order to provide the required intensity of service to ensure the patient's safety. The patient's presenting symptoms, physical exam findings, and initial radiographic and laboratory data in the context of their medical condition is felt to place them at decreased risk for  further clinical deterioration. Furthermore, it is anticipated that the patient will be medically stable for discharge from the hospital within 2 midnights of admission.   Author: Jonnie Finner, DO 08/05/2021 2:49 PM  For on call review [email or web address].

## 2021-08-05 NOTE — ED Notes (Signed)
Patient updated on plan of care

## 2021-08-06 ENCOUNTER — Inpatient Hospital Stay: Payer: Managed Care, Other (non HMO)

## 2021-08-06 ENCOUNTER — Inpatient Hospital Stay: Payer: Managed Care, Other (non HMO) | Admitting: Oncology

## 2021-08-06 ENCOUNTER — Observation Stay (HOSPITAL_COMMUNITY): Payer: Managed Care, Other (non HMO)

## 2021-08-06 DIAGNOSIS — M546 Pain in thoracic spine: Secondary | ICD-10-CM | POA: Diagnosis not present

## 2021-08-06 DIAGNOSIS — I10 Essential (primary) hypertension: Secondary | ICD-10-CM | POA: Diagnosis present

## 2021-08-06 DIAGNOSIS — E1165 Type 2 diabetes mellitus with hyperglycemia: Secondary | ICD-10-CM | POA: Diagnosis present

## 2021-08-06 DIAGNOSIS — C187 Malignant neoplasm of sigmoid colon: Secondary | ICD-10-CM | POA: Diagnosis present

## 2021-08-06 DIAGNOSIS — R21 Rash and other nonspecific skin eruption: Secondary | ICD-10-CM | POA: Diagnosis present

## 2021-08-06 DIAGNOSIS — C7802 Secondary malignant neoplasm of left lung: Secondary | ICD-10-CM | POA: Diagnosis not present

## 2021-08-06 DIAGNOSIS — M545 Low back pain, unspecified: Secondary | ICD-10-CM | POA: Diagnosis present

## 2021-08-06 DIAGNOSIS — Z86718 Personal history of other venous thrombosis and embolism: Secondary | ICD-10-CM | POA: Diagnosis not present

## 2021-08-06 DIAGNOSIS — C7800 Secondary malignant neoplasm of unspecified lung: Secondary | ICD-10-CM | POA: Diagnosis present

## 2021-08-06 DIAGNOSIS — Z7984 Long term (current) use of oral hypoglycemic drugs: Secondary | ICD-10-CM | POA: Diagnosis not present

## 2021-08-06 DIAGNOSIS — T451X5A Adverse effect of antineoplastic and immunosuppressive drugs, initial encounter: Secondary | ICD-10-CM | POA: Diagnosis present

## 2021-08-06 DIAGNOSIS — C787 Secondary malignant neoplasm of liver and intrahepatic bile duct: Secondary | ICD-10-CM | POA: Diagnosis present

## 2021-08-06 DIAGNOSIS — D696 Thrombocytopenia, unspecified: Secondary | ICD-10-CM | POA: Diagnosis present

## 2021-08-06 DIAGNOSIS — Z79899 Other long term (current) drug therapy: Secondary | ICD-10-CM | POA: Diagnosis not present

## 2021-08-06 DIAGNOSIS — R52 Pain, unspecified: Secondary | ICD-10-CM | POA: Diagnosis present

## 2021-08-06 DIAGNOSIS — Z006 Encounter for examination for normal comparison and control in clinical research program: Secondary | ICD-10-CM | POA: Diagnosis not present

## 2021-08-06 DIAGNOSIS — Z794 Long term (current) use of insulin: Secondary | ICD-10-CM | POA: Diagnosis not present

## 2021-08-06 DIAGNOSIS — G893 Neoplasm related pain (acute) (chronic): Secondary | ICD-10-CM | POA: Diagnosis present

## 2021-08-06 DIAGNOSIS — Z888 Allergy status to other drugs, medicaments and biological substances status: Secondary | ICD-10-CM | POA: Diagnosis not present

## 2021-08-06 DIAGNOSIS — Z20822 Contact with and (suspected) exposure to covid-19: Secondary | ICD-10-CM | POA: Diagnosis present

## 2021-08-06 DIAGNOSIS — C7801 Secondary malignant neoplasm of right lung: Secondary | ICD-10-CM | POA: Diagnosis not present

## 2021-08-06 LAB — COMPREHENSIVE METABOLIC PANEL
ALT: 18 U/L (ref 0–44)
AST: 19 U/L (ref 15–41)
Albumin: 3 g/dL — ABNORMAL LOW (ref 3.5–5.0)
Alkaline Phosphatase: 171 U/L — ABNORMAL HIGH (ref 38–126)
Anion gap: 6 (ref 5–15)
BUN: 20 mg/dL (ref 6–20)
CO2: 23 mmol/L (ref 22–32)
Calcium: 8.7 mg/dL — ABNORMAL LOW (ref 8.9–10.3)
Chloride: 104 mmol/L (ref 98–111)
Creatinine, Ser: 1.31 mg/dL — ABNORMAL HIGH (ref 0.61–1.24)
GFR, Estimated: >60 mL/min (ref >60)
Glucose, Bld: 153 mg/dL — ABNORMAL HIGH (ref 70–99)
Potassium: 4.7 mmol/L (ref 3.5–5.1)
Sodium: 133 mmol/L — ABNORMAL LOW (ref 135–145)
Total Bilirubin: 1.4 mg/dL — ABNORMAL HIGH (ref 0.3–1.2)
Total Protein: 5.8 g/dL — ABNORMAL LOW (ref 6.5–8.1)

## 2021-08-06 LAB — CBC WITH DIFFERENTIAL/PLATELET
Abs Immature Granulocytes: 0.05 10*3/uL (ref 0.00–0.07)
Basophils Absolute: 0.1 10*3/uL (ref 0.0–0.1)
Basophils Relative: 1 %
Eosinophils Absolute: 0.3 10*3/uL (ref 0.0–0.5)
Eosinophils Relative: 4 %
HCT: 40.7 % (ref 39.0–52.0)
Hemoglobin: 13 g/dL (ref 13.0–17.0)
Immature Granulocytes: 1 %
Lymphocytes Relative: 23 %
Lymphs Abs: 1.7 10*3/uL (ref 0.7–4.0)
MCH: 27.7 pg (ref 26.0–34.0)
MCHC: 31.9 g/dL (ref 30.0–36.0)
MCV: 86.8 fL (ref 80.0–100.0)
Monocytes Absolute: 0.9 10*3/uL (ref 0.1–1.0)
Monocytes Relative: 12 %
Neutro Abs: 4.3 10*3/uL (ref 1.7–7.7)
Neutrophils Relative %: 59 %
Platelets: 129 10*3/uL — ABNORMAL LOW (ref 150–400)
RBC: 4.69 MIL/uL (ref 4.22–5.81)
RDW: 14.2 % (ref 11.5–15.5)
WBC: 7.3 10*3/uL (ref 4.0–10.5)
nRBC: 0 % (ref 0.0–0.2)

## 2021-08-06 LAB — GLUCOSE, CAPILLARY
Glucose-Capillary: 186 mg/dL — ABNORMAL HIGH (ref 70–99)
Glucose-Capillary: 171 mg/dL — ABNORMAL HIGH (ref 70–99)
Glucose-Capillary: 165 mg/dL — ABNORMAL HIGH (ref 70–99)
Glucose-Capillary: 209 mg/dL — ABNORMAL HIGH (ref 70–99)
Glucose-Capillary: 228 mg/dL — ABNORMAL HIGH (ref 70–99)

## 2021-08-06 LAB — MAGNESIUM: Magnesium: 1.4 mg/dL — ABNORMAL LOW (ref 1.7–2.4)

## 2021-08-06 LAB — HIV ANTIBODY (ROUTINE TESTING W REFLEX): HIV Screen 4th Generation wRfx: NONREACTIVE

## 2021-08-06 MED ORDER — POLYETHYLENE GLYCOL 3350 17 G PO PACK
17.0000 g | PACK | Freq: Every day | ORAL | Status: DC
  Administered 2021-08-06 – 2021-08-07 (×2): 17 g via ORAL
  Filled 2021-08-06 (×2): qty 1

## 2021-08-06 MED ORDER — GADOBUTROL 1 MMOL/ML IV SOLN
10.0000 mL | Freq: Once | INTRAVENOUS | Status: AC | PRN
  Administered 2021-08-06: 10 mL via INTRAVENOUS

## 2021-08-06 MED ORDER — SODIUM CHLORIDE 0.9% FLUSH
10.0000 mL | Freq: Two times a day (BID) | INTRAVENOUS | Status: DC
  Administered 2021-08-06 – 2021-08-07 (×3): 10 mL

## 2021-08-06 MED ORDER — MAGNESIUM SULFATE 2 GM/50ML IV SOLN
2.0000 g | Freq: Once | INTRAVENOUS | Status: AC
  Administered 2021-08-06: 2 g via INTRAVENOUS
  Filled 2021-08-06: qty 50

## 2021-08-06 MED ORDER — SODIUM CHLORIDE 0.9% FLUSH: 10.0000 mL | INTRAVENOUS | Status: DC | PRN

## 2021-08-06 MED ORDER — CHLORHEXIDINE GLUCONATE CLOTH 2 % EX PADS
6.0000 | MEDICATED_PAD | Freq: Every day | CUTANEOUS | Status: DC
  Administered 2021-08-06 – 2021-08-07 (×2): 6 via TOPICAL

## 2021-08-06 MED ORDER — MAGNESIUM OXIDE -MG SUPPLEMENT 400 (240 MG) MG PO TABS
400.0000 mg | ORAL_TABLET | Freq: Two times a day (BID) | ORAL | Status: DC
  Administered 2021-08-06 – 2021-08-07 (×3): 400 mg via ORAL
  Filled 2021-08-06 (×3): qty 1

## 2021-08-06 NOTE — Progress Notes (Addendum)
IP PROGRESS NOTE  Subjective:   Pain was initially well controlled this morning but after getting up and walking in the hallway, he reported significant pain to his right mid back that radiates down to his flank.  He is also reporting some abdominal pain this morning with radiation of pain to the right chest.  He is not reporting any shortness of breath this morning.  Objective: Vital signs in last 24 hours: Blood pressure 129/88, pulse 96, temperature 98.3 F (36.8 C), temperature source Oral, resp. rate 18, height 5' 10" (1.778 m), weight 104.3 kg, SpO2 93 %.  Intake/Output from previous day: 02/20 0701 - 02/21 0700 In: 240 [P.O.:240] Out: -   Physical Exam:  Lungs: Decreased breath sounds, no respiratory distress Cardiac: Regular rate and rhythm Abdomen: Nontender, no hepatosplenomegaly, no mass Extremities: No leg edema Neurologic: Alert and oriented, the motor exam appears intact in the legs and feet bilaterally Skin: Acne type rash over the trunk MSK: Palpation over right mid back without point tenderness  Portacath/PICC-without erythema  Lab Results: Recent Labs    08/04/21 2040 08/06/21 0532  WBC 10.2 7.3  HGB 14.8 13.0  HCT 44.6 40.7  PLT 159 129*    BMET Recent Labs    08/05/21 1547 08/06/21 0532  NA 136 133*  K 4.2 4.7  CL 106 104  CO2 23 23  GLUCOSE 184* 153*  BUN 19 20  CREATININE 1.23 1.31*  CALCIUM 8.6* 8.7*    Lab Results  Component Value Date   CEA1 7.53 (H) 07/22/2021    Studies/Results: DG Chest 2 View  Result Date: 08/04/2021 CLINICAL DATA:  Sharp back pain behind shoulder blades. EXAM: CHEST - 2 VIEW COMPARISON:  Chest plain film, dated February 28, 2007 and chest CT, dated July 16, 2021. FINDINGS: A right-sided venous Port-A-Cath is seen with its distal tip overlying the junction of the superior vena cava and right atrium. A 21 mm x 16 mm well-defined radiopaque lung nodule is seen overlying the mid right lung. A 9 mm diameter  lung nodule is also seen overlying the medial aspect of the right upper lobe. These correspond to lung nodules seen within the right lung on the prior chest CT. There is no evidence of acute infiltrate, pleural effusion or pneumothorax. The heart size and mediastinal contours are within normal limits. The visualized skeletal structures are unremarkable. IMPRESSION: 1. Lung nodules within the mid and upper right lung, as described above, consistent with the patient's known history of pulmonary metastasis. 2. No acute infiltrate. Electronically Signed   By: Virgina Norfolk M.D.   On: 08/04/2021 21:18   CT Angio Chest/Abd/Pel for Dissection W and/or Wo Contrast  Result Date: 08/04/2021 CLINICAL DATA:  Acute aortic syndrome (AAS) suspected Acute onset of back pain. Patient with history of stage IV colon cancer. EXAM: CT ANGIOGRAPHY CHEST, ABDOMEN AND PELVIS TECHNIQUE: Non-contrast CT of the chest was initially obtained. Multidetector CT imaging through the chest, abdomen and pelvis was performed using the standard protocol during bolus administration of intravenous contrast. Multiplanar reconstructed images and MIPs were obtained and reviewed to evaluate the vascular anatomy. RADIATION DOSE REDUCTION: This exam was performed according to the departmental dose-optimization program which includes automated exposure control, adjustment of the mA and/or kV according to patient size and/or use of iterative reconstruction technique. CONTRAST:  173m OMNIPAQUE IOHEXOL 350 MG/ML SOLN COMPARISON:  Chest radiograph earlier today. Recent staging abdominopelvic CT 07/16/2021 FINDINGS: CTA CHEST FINDINGS Cardiovascular: The thoracic aorta is normal in  caliber. No aortic dissection, aneurysm, acute aortic syndrome. No aortic hematoma noncontrast exam. No significant atherosclerosis. No periaortic stranding. Right chest port in place. Exam not tailored for evaluation of pulmonary arteries, there is minimal opacification of the  pulmonary arteries on the current exam. Heart is normal in size. Mediastinum/Nodes: 2 cm enlarged right hilar lymph node is new from prior exam. There is small additional mediastinal lymph nodes are not enlarged by size criteria. Patulous distal esophagus. Lungs/Pleura: Pulmonary metastatic disease with multiple pulmonary nodules. Right lower lobe nodule measures 3.5 x 3.7 cm, series 6, image 38, previously 2.8 x 2.7 cm. Paramediastinal left upper lobe nodule measures 2.8 x 2.3 cm, series 6, image 33, previously 2.2 x 1.7 cm. Anterior right upper lobe nodule measures 1.9 x 2 cm, series 6, image 26, previously 1.7 x 1.4 cm. Many of the additional pulmonary nodules are also larger than on prior exam. No airspace consolidation. Trace pleural thickening without significant effusion. Musculoskeletal: No lytic or blastic osseous lesions. No acute osseous findings to explain pain. No subcutaneous soft tissue abnormalities. Review of the MIP images confirms the above findings. CTA ABDOMEN AND PELVIS FINDINGS VASCULAR Aorta: Normal caliber aorta without aneurysm, dissection, vasculitis or significant stenosis. Mild calcified atherosclerosis. Celiac: Patent without evidence of aneurysm, dissection, vasculitis or significant stenosis. SMA: Patent without evidence of aneurysm, dissection, vasculitis or significant stenosis. Renals: 2 right renal arteries with small artery supplying the upper pole. Single left renal artery with early branching. All renal arteries are patent IMA: Patent. Inflow: Patent without evidence of aneurysm, dissection, vasculitis or significant stenosis. Veins: Not well assessed on this arterial phase exam. Review of the MIP images confirms the above findings. NON-VASCULAR Hepatobiliary: Patient's known hepatic metastatic disease is not well assessed on this arterial phase exam. Confluent posterior right lobe liver lesion measures approximately 11.7 x 10.1 cm, unchanged from prior exam. Confluent lesion  in the inferior right hepatic lobe is difficult to measure. Many of the additional liver lesions are not well-defined on this arterial phase exam. Gallbladder is partially distended. There is no gallstone or gallbladder wall thickening, however there is mild no common bile duct dilatation. Stranding of the pericholecystic fat. Pancreas: No ductal dilatation or inflammation. No evidence of pancreatic mass. Spleen: Splenomegaly is again seen. The spleen measures 17.9 x 6.0 x 10.8 cm (volume = 610 cm^3), previous splenic volume was 943 cc. Normal splenic arterial enhancement. Adrenals/Urinary Tract: No adrenal nodules. There is no hydronephrosis. Moderate bilateral perinephric edema, new in the interim. Small amount of fluid tracks into the anterior pararenal spaces and retroperitoneum. No visualized renal calculi. Urinary bladder is decompressed, no obvious bladder wall thickening. Stomach/Bowel: Lack of enteric contrast and arterial phase imaging limits detailed assessment. May be a tiny hiatal hernia. The stomach is decompressed. There is no small bowel obstruction or evidence of small bowel inflammation. Normal appendix. Small to moderate colonic stool burden. No colonic wall thickening or colonic inflammation. Lymphatic: No enlarged abdominopelvic lymph nodes. Reproductive: No acute findings. Other: No ascites. No evidence of omental thickening. There is a tiny fat containing umbilical hernia. Musculoskeletal: No lytic or blastic osseous lesions. No acute osseous findings. Review of the MIP images confirms the above findings. IMPRESSION: 1. No aortic dissection or acute aortic abnormality. 2. Progression in thoracic metastatic disease with increased size of multiple bilateral pulmonary nodules. There is a new enlarged right hilar lymph node. 3. Patient's known hepatic metastatic disease is less well-defined on this arterial phase exam, confluent hepatic metastasis  are grossly similar. 4. Moderate bilateral  perinephric edema with trace fluid tracking into the anterior pararenal spaces and retroperitoneum. This is nonspecific, and there are no specific findings of pyelonephritis on this arterial phase exam. 5. Splenomegaly. Electronically Signed   By: Keith Rake M.D.   On: 08/04/2021 23:38    Medications: I have reviewed the patient's current medications.  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 and CDX2 positive FOLFOX plus bevacizumab, cycle 1 08/24/2019, 11 total cycles, oxaliplatin held with cycle 11 secondary to a reaction with cycle 10 FOLFOX plus bevacizumab discontinued after cycle 19 secondary to disease progression FOLFIRI plus bevacizumab beginning 06/20/2020, status post 12 cycles Disease progression on CT 02/11/2021-enlarging lung nodules and liver lesions CEA increased 02/13/2021 CTs 03/11/2021-bulky hypodense liver metastases, slightly progressive, enlargement of bilateral lung nodules, slight enlargement of mediastinal nodes, splenomegaly and gastroesophageal varices Sotorasib/panitumumab on the [employer] study beginning 04/01/2021 CTs 05/21/2021-decrease size of lung and liver metastases, no new lesions CTs 07/16/2021-new and enlarged lung lesions, increased size of liver lesions, no new lesions, no new site of metastatic disease Sotorasib/panitumumab continued to 07/23/2021     Type 2 diabetes Left lower extremity DVT 2018 Oxaliplatin reaction with cycle 10 Skin rash most likely related to Panitumumab, grade 2  04/15/2021 Thrombocytopenia Hypomagnesemia secondary to panitumumab Admission 08/05/2021 with new onset severe back pain    Dan Bell continues to have pain to his right mid back particularly with movement/ambulation.  His pain seems to radiate to his flank and he is also reports some abdominal discomfort and right chest discomfort this morning.  No bone lesions or pleural disease were noted on CT imaging to explain the pain.  Recommend MRI of the thoracic spine to further evaluate.  He has metastatic colon cancer.  There is evidence of disease progression in the lungs on the CT performed on admission.  We recommend discontinuing the sotorasib and panitumumab.  The patient has a Port-A-Cath which we are using for chemotherapy.  It is not accessed.  I have placed a IV team referral to access the Port-A-Cath.  Additionally, labs may be drawn from his port.  Recommendations:  MRI thoracic spine Access Port-A-Cath Narcotic analgesics for pain Discontinue sotorasib and panitumumab   LOS: 0 days   Mikey Bussing, NP   08/06/2021, 8:17 AM  Mr. Yamada was interviewed and examined.  He continues to have pain at the right mid back.  He has pain at the flank and lower back with ambulation.  He reports partial relief of pain with oxycodone.  The etiology of the pain remains unclear.  We will proceed with imaging of the spine.  I discussed systemic treatment options with Mr. Stankovich.  Sotorasib and panitumumab will be discontinued.  We discussed standard treatment with Lonsurf/Avastin versus referral for clinical trial.  I will begin investigating clinical trial options.  I was present for greater than 50% of today's visit.

## 2021-08-06 NOTE — Assessment & Plan Note (Addendum)
Blood sugar well controlled.resume home meds. Recent Labs  Lab 08/05/21 1420 08/05/21 1643 08/06/21 0752 08/06/21 1219 08/06/21 1813 08/06/21 1956 08/07/21 0730  GLUCAP  --    < > 171* 165* 209* 228* 144*  HGBA1C 7.1*  --   --   --   --   --   --    < > = values in this interval not displayed.

## 2021-08-06 NOTE — Hospital Course (Addendum)
51 y.o. male with medical history significant of colon CA w/ liver/lung mets-being treated with sotorasib and panitumumab, DM2, HTN presented with back pain and dyspnea. His imaging shows worsening metastatic disease, but no MSK involvement. No PNA. He's on a clinical trial right now. In the ED,CT angio negative for PE but showed liver mets appear larger, patient was admitted for pain control oncology was consulted.Dr. Ammie Dalton advised discontinue sotorasib and panitumumab, pain management narcotics,, consider bone scan or MRI for persistent severe back pain for further evaluation of hematuria. UA rbc 6-10, wbc 0-5. Hba1c 7.1, mildly up creat at 1.2-1.3- baseline.  With ongoing back pain underwent MRI of thoracic and lumbar spine no metastasis no acute significant finding.  Patient was treated with pain management, at this time clinically stable he is being discharged on pain medication and stool softener.  he will continue to follow-up with oncology as outpatient

## 2021-08-06 NOTE — Assessment & Plan Note (Addendum)
Patient with intractable pain, underwent MRI lumbar and thoracic spine see report below.  At this time plan is to continue with oxycodone pain control muscle accident and stool softener and outpatient follow-up with hematology oncology.  1.No evidence for metastatic disease to the thoracic or lumbar spine. 2. 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 3. Right greater than left foraminal narrowing at T10-11 and T11-12 due to facet hypertrophy. 4. Mild disc bulging at L4-5 without significant stenosis. 5. Extensive lung nodules documented on recent CTA of the chest.

## 2021-08-06 NOTE — Assessment & Plan Note (Signed)
Repleted with IV and p.o.

## 2021-08-06 NOTE — Assessment & Plan Note (Signed)
See back pain.

## 2021-08-06 NOTE — Progress Notes (Signed)
°   08/06/21 1954  Assess: MEWS Score  Temp 98.5 F (36.9 C)  BP (!) 135/91  Pulse Rate (!) 116  Resp 16  SpO2 95 %  O2 Device Room Air  Assess: MEWS Score  MEWS Temp 0  MEWS Systolic 0  MEWS Pulse 2  MEWS RR 0  MEWS LOC 0  MEWS Score 2  MEWS Score Color Yellow  Assess: if the MEWS score is Yellow or Red  Were vital signs taken at a resting state? Yes  Focused Assessment No change from prior assessment  Does the patient meet 2 or more of the SIRS criteria? No  Does the patient have a confirmed or suspected source of infection? No  MEWS guidelines implemented *See Row Information* Yes  Treat  Pain Scale 0-10  Pain Score 1  Pain Intervention(s) Repositioned;Distraction;Emotional support (will administer Pain medicine)  Take Vital Signs  Increase Vital Sign Frequency  Yellow: Q 2hr X 2 then Q 4hr X 2, if remains yellow, continue Q 4hrs  Escalate  MEWS: Escalate Yellow: discuss with charge nurse/RN and consider discussing with provider and RRT (Pulse rate getting better, going down to 116 from 118, MD and charge nurse are aware. Patient is stable, will continue  monitor.)  Assess: SIRS CRITERIA  SIRS Temperature  0  SIRS Pulse 1  SIRS Respirations  0  SIRS WBC 0  SIRS Score Sum  1

## 2021-08-06 NOTE — Progress Notes (Signed)
PROGRESS NOTE Dan Bell  MIW:[id number] DOB: [birth year] DOA: 08/04/2021 PCP: Crista Elliot, PA-C   Brief Narrative/Hospital Course: 51 y.o. male with medical history significant of colon CA w/ liver/lung mets-being treated with sotorasib and panitumumab, DM2, HTN presented with back pain and dyspnea. His imaging shows worsening metastatic disease, but no MSK involvement. No PNA. He's on a clinical trial right now. In the ED CT angio negative for PE but showed liver mets appear larger, patient was admitted for pain control oncology was consulted.Dr. Ammie Dalton advised discontinue sotorasib and panitumumab, pain management narcotics,, consider bone scan or MRI for persistent severe back pain for further evaluation of hematuria. UA rbc 6-10, wbc 0-5. Hba1c 7.1, mildld up creat at 1.2-1.3- baseline.    Subjective: Seen and examined this morning.  Patient reports excreting pain while walking and had to come back to the bed.  Pain in the medial low back. No bowel bladder incontinence no focal weakness or numbness tingling.  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 CTs 1/31-and large lung lesion increased liver lesions, no new site of metastasis.  Continued on same chemo.Oncology following closely and planning to stop Sotorasib and panitumumab Onco disscussed standard treatment with Lonsurf/Avastin versus referral for clinical trial.   Intractable pain- (present on admission) See back pain  Back pain Patient with intractable pain, no neurological deficit has a flank and lower back pain with ambulation.  Obtaining MRI thoracic spine lumbar spine, continue with pain management IV and oral opiates.  HTN (hypertension) BP stable.  Monitor  DM2 (diabetes mellitus, type 2) ([place]) Blood sugar well  controlled.  Continue sliding scale. Recent Labs  Lab 08/05/21 1240 08/05/21 1420 08/05/21 1643 08/05/21 2156 08/06/21 0752 08/06/21 1219  GLUCAP 242*  --  184* 186* 171* 165*  HGBA1C  --  7.1*  --   --   --   --     Cancer of sigmoid colon ([place])- (present on admission) See #1   Class I Obesity:Patient's Body mass index is 33 kg/m. : Will benefit with PCP follow-up, weight loss  healthy lifestyle and outpatient sleep evaluation.  DVT prophylaxis: SCDs Start: 08/05/21 1616 Code Status:   Code Status: Full Code Family Communication: plan of care discussed with patient at bedside. Discussed with oncology Disposition: Currently  not medically stable for discharge. Status is: Inpatient Remains inpatient appropriate because: Ongoing management of pain control  Objective: Vitals last 24 hrs: Vitals:   08/05/21 1434 08/05/21 1828 08/05/21 2155 08/06/21 0351  BP: (!) 130/95 122/88 126/85 129/88  Pulse: 81 94 89 96  Resp: 16 16 18 18   Temp: 97.8 F (36.6 C) 97.9 F (36.6 C) 98.8 F (37.1 C) 98.3 F (36.8 C)  TempSrc: Oral Oral Oral Oral  SpO2: 97% 97% 94% 93%  Weight:      Height:       Weight change:   Physical Examination: General exam: Aa0x3, pleasant,older than stated age, weak appearing. HEENT:Oral mucosa moist, Ear/Nose WNL grossly, dentition normal. Respiratory system: bilaterally diminished BS, no use of accessory muscle Cardiovascular system: S1 & S2 +, No JVD,. Gastrointestinal system: Abdomen soft,NT,ND, BS+ Nervous System:Alert, awake, moving extremities and grossly nonfocal Extremities: LE edema none,distal peripheral pulses palpable.  Skin: No rashes,no icterus. MSK: Normal muscle bulk,tone, power  Medications reviewed:  Scheduled Meds:  Chlorhexidine Gluconate Cloth  6 each Topical  Daily   docusate sodium  100 mg Oral BID   doxycycline  100 mg Oral q morning   insulin aspart  0-15 Units Subcutaneous TID WC   insulin aspart  0-5 Units Subcutaneous  QHS   magnesium oxide  400 mg Oral BID   sodium chloride flush  10-40 mL Intracatheter Q12H   Continuous Infusions:    Diet Order             Diet Carb Modified Fluid consistency: Thin; Room service appropriate? Yes  Diet effective now                            Intake/Output Summary (Last 24 hours) at 08/06/2021 1346 Last data filed at 08/06/2021 1006 Gross per 24 hour  Intake 480 ml  Output --  Net 480 ml   Net IO Since Admission: 2,480 mL [08/06/21 1346]  Wt Readings from Last 3 Encounters:  08/04/21 104.3 kg  07/23/21 106.1 kg  07/09/21 107.5 kg     Unresulted Labs (From admission, onward)    None     Data Reviewed: I have personally reviewed following labs and imaging studies CBC: Recent Labs  Lab 08/04/21 2040 08/06/21 0532  WBC 10.2 7.3  NEUTROABS 6.8 4.3  HGB 14.8 13.0  HCT 44.6 40.7  MCV 84.2 86.8  PLT 159 403*   Basic Metabolic Panel: Recent Labs  Lab 08/04/21 2040 08/05/21 1547 08/06/21 0532 08/06/21 0753  NA 134* 136 133*  --   K 4.1 4.2 4.7  --   CL 104 106 104  --   CO2 21* 23 23  --   GLUCOSE 251* 184* 153*  --   BUN 16 19 20   --   CREATININE 1.37* 1.23 1.31*  --   CALCIUM 8.8* 8.6* 8.7*  --   MG  --   --   --  1.4*   GFR: Estimated Creatinine Clearance: 82.5 mL/min (A) (by C-G formula based on SCr of 1.31 mg/dL (H)). Liver Function Tests: Recent Labs  Lab 08/04/21 2259 08/05/21 1547 08/06/21 0532  AST 17 18 19   ALT 18 17 18   ALKPHOS 184* 161* 171*  BILITOT 1.6* 1.1 1.4*  PROT 6.7 5.9* 5.8*  ALBUMIN 3.3* 3.2* 3.0*   Recent Labs  Lab 08/04/21 2259  LIPASE 26   No results for input(s): AMMONIA in the last 168 hours. Coagulation Profile: No results for input(s): INR, PROTIME in the last 168 hours. Cardiac Enzymes: No results for input(s): CKTOTAL, CKMB, CKMBINDEX, TROPONINI in the last 168 hours. BNP (last 3 results) No results for input(s): PROBNP in the last 8760 hours. HbA1C: Recent Labs     08/05/21 1420  HGBA1C 7.1*   CBG: Recent Labs  Lab 08/05/21 1240 08/05/21 1643 08/05/21 2156 08/06/21 0752 08/06/21 1219  GLUCAP 242* 184* 186* 171* 165*   Lipid Profile: No results for input(s): CHOL, HDL, LDLCALC, TRIG, CHOLHDL, LDLDIRECT in the last 72 hours. Thyroid Function Tests: No results for input(s): TSH, T4TOTAL, FREET4, T3FREE, THYROIDAB in the last 72 hours. Anemia Panel: No results for input(s): VITAMINB12, FOLATE, FERRITIN, TIBC, IRON, RETICCTPCT in the last 72 hours. Sepsis Labs: No results for input(s): PROCALCITON, LATICACIDVEN in the last 168 hours.  Recent Results (from the past 240 hour(s))  Resp Panel by RT-PCR (Flu A&B, Covid) Nasopharyngeal Swab     Status: None   Collection Time: 08/05/21 12:24 AM   Specimen: Nasopharyngeal Swab; Nasopharyngeal(NP) swabs in  vial transport medium  Result Value Ref Range Status   SARS Coronavirus 2 by RT PCR NEGATIVE NEGATIVE Final    Comment: (NOTE) SARS-CoV-2 target nucleic acids are NOT DETECTED.  The SARS-CoV-2 RNA is generally detectable in upper respiratory specimens during the acute phase of infection. The lowest concentration of SARS-CoV-2 viral copies this assay can detect is 138 copies/mL. A negative result does not preclude SARS-Cov-2 infection and should not be used as the sole basis for treatment or other patient management decisions. A negative result may occur with  improper specimen collection/handling, submission of specimen other than nasopharyngeal swab, presence of viral mutation(s) within the areas targeted by this assay, and inadequate number of viral copies(<138 copies/mL). A negative result must be combined with clinical observations, patient history, and epidemiological information. The expected result is Negative.  Fact Sheet for Patients:  EntrepreneurPulse.com.au  Fact Sheet for Healthcare Providers:  IncredibleEmployment.be  This test is no t yet  approved or cleared by the [place] FDA and  has been authorized for detection and/or diagnosis of SARS-CoV-2 by FDA under an Emergency Use Authorization (EUA). This EUA will remain  in effect (meaning this test can be used) for the duration of the COVID-19 declaration under Section 564(b)(1) of the Act, 21 U.S.C.section 360bbb-3(b)(1), unless the authorization is terminated  or revoked sooner.       Influenza A by PCR NEGATIVE NEGATIVE Final   Influenza B by PCR NEGATIVE NEGATIVE Final    Comment: (NOTE) The Xpert Xpress SARS-CoV-2/FLU/RSV plus assay is intended as an aid in the diagnosis of influenza from Nasopharyngeal swab specimens and should not be used as a sole basis for treatment. Nasal washings and aspirates are unacceptable for Xpert Xpress SARS-CoV-2/FLU/RSV testing.  Fact Sheet for Patients: EntrepreneurPulse.com.au  Fact Sheet for Healthcare Providers: IncredibleEmployment.be  This test is not yet approved or cleared by the [place] FDA and has been authorized for detection and/or diagnosis of SARS-CoV-2 by FDA under an Emergency Use Authorization (EUA). This EUA will remain in effect (meaning this test can be used) for the duration of the COVID-19 declaration under Section 564(b)(1) of the Act, 21 U.S.C. section 360bbb-3(b)(1), unless the authorization is terminated or revoked.  Performed at [facility], [address] [place], [place] [address]     Antimicrobials: Anti-infectives (From admission, onward)    Start     Dose/Rate Route Frequency Ordered Stop   08/06/21 1000  doxycycline (VIBRA-TABS) tablet 100 mg        100 mg Oral Every morning 08/05/21 1616        Culture/Microbiology No results found for: SDES, SPECREQUEST, CULT, REPTSTATUS  Other culture-see note  Radiology Studies: DG Chest 2 View  Result Date: 08/04/2021 CLINICAL DATA:  Hervey Ard back pain behind shoulder blades. EXAM: CHEST -  2 VIEW COMPARISON:  Chest plain film, dated February 28, 2007 and chest CT, dated July 16, 2021. FINDINGS: A right-sided venous Port-A-Cath is seen with its distal tip overlying the junction of the superior vena cava and right atrium. A 21 mm x 16 mm well-defined radiopaque lung nodule is seen overlying the mid right lung. A 9 mm diameter lung nodule is also seen overlying the medial aspect of the right upper lobe. These correspond to lung nodules seen within the right lung on the prior chest CT. There is no evidence of acute infiltrate, pleural effusion or pneumothorax. The heart size and mediastinal contours are within normal limits. The visualized skeletal structures are  unremarkable. IMPRESSION: 1. Lung nodules within the mid and upper right lung, as described above, consistent with the patient's known history of pulmonary metastasis. 2. No acute infiltrate. Electronically Signed   By: Virgina Norfolk M.D.   On: 08/04/2021 21:18   MR THORACIC SPINE W WO CONTRAST  Result Date: 08/06/2021 CLINICAL DATA:  Mid back pain. Metastatic colon cancer with disease progression. Lung metastases. EXAM: MRI THORACIC AND LUMBAR SPINE WITHOUT AND WITH CONTRAST TECHNIQUE: Multiplanar and multiecho pulse sequences of the thoracic and lumbar spine were obtained without and with intravenous contrast. CONTRAST:  62mL GADAVIST GADOBUTROL 1 MMOL/ML IV SOLN COMPARISON:  CT a chest, abdomen, and pelvis 08/04/2021 FINDINGS: MRI THORACIC SPINE FINDINGS Alignment:  No significant listhesis is present. Vertebrae: Nonenhancing hemangioma is at T6 and T11 measure 7 and 11 mm respectively. No in hand sing lesions are present in the thoracic spine. Cord:  Normal signal and morphology. Paraspinal and other soft tissues: Multiple bilateral lung nodules are again noted. 4 cm lung mass is present at the right lung base. Asymmetric right pleural effusion is present. Paraspinous soft tissues are unremarkable. A nonenhancing cystic lesion is  present in the left T8-9 interspace, likely perineural root sleeve cyst. Disc levels: No significant central disc protrusion or central stenosis is present. Right greater than left foraminal narrowing at T10-11 is due to facet hypertrophy. Mild foraminal narrowing is present bilaterally at T11-12 due to facet disease. The central canal and foramina are otherwise patent. MRI LUMBAR SPINE FINDINGS Segmentation: 5 non rib-bearing lumbar type vertebral bodies are present. The lowest fully formed vertebral body is L5. Alignment:  No significant listhesis is present. Vertebrae: Marrow signal and vertebral body heights are normal. No enhancing lesions are present. Conus medullaris: Extends to the L1-2 level and appears normal. Paraspinal and other soft tissues: Limited imaging the abdomen is unremarkable. There is no significant adenopathy. No solid organ lesions are present. Paraspinous musculature is unremarkable. Disc levels: L1-2: Negative. L2-3: Negative. L3-4: Negative. L4-5: Mild disc bulging is present without significant stenosis. L5-S1: Negative. IMPRESSION: 1. No evidence for metastatic disease to the thoracic or lumbar spine. 2. 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 3. Right greater than left foraminal narrowing at T10-11 and T11-12 due to facet hypertrophy. 4. Mild disc bulging at L4-5 without significant stenosis. 5. Extensive lung nodules documented on recent CTA of the chest. Electronically Signed   By: San Morelle M.D.   On: 08/06/2021 12:00   MR Lumbar Spine W Wo Contrast  Result Date: 08/06/2021 CLINICAL DATA:  Mid back pain. Metastatic colon cancer with disease progression. Lung metastases. EXAM: MRI THORACIC AND LUMBAR SPINE WITHOUT AND WITH CONTRAST TECHNIQUE: Multiplanar and multiecho pulse sequences of the thoracic and lumbar spine were obtained without and with intravenous contrast. CONTRAST:  88mL GADAVIST GADOBUTROL 1 MMOL/ML IV SOLN COMPARISON:  CT a chest, abdomen, and pelvis  08/04/2021 FINDINGS: MRI THORACIC SPINE FINDINGS Alignment:  No significant listhesis is present. Vertebrae: Nonenhancing hemangioma is at T6 and T11 measure 7 and 11 mm respectively. No in hand sing lesions are present in the thoracic spine. Cord:  Normal signal and morphology. Paraspinal and other soft tissues: Multiple bilateral lung nodules are again noted. 4 cm lung mass is present at the right lung base. Asymmetric right pleural effusion is present. Paraspinous soft tissues are unremarkable. A nonenhancing cystic lesion is present in the left T8-9 interspace, likely perineural root sleeve cyst. Disc levels: No significant central disc protrusion or central stenosis is present.  Right greater than left foraminal narrowing at T10-11 is due to facet hypertrophy. Mild foraminal narrowing is present bilaterally at T11-12 due to facet disease. The central canal and foramina are otherwise patent. MRI LUMBAR SPINE FINDINGS Segmentation: 5 non rib-bearing lumbar type vertebral bodies are present. The lowest fully formed vertebral body is L5. Alignment:  No significant listhesis is present. Vertebrae: Marrow signal and vertebral body heights are normal. No enhancing lesions are present. Conus medullaris: Extends to the L1-2 level and appears normal. Paraspinal and other soft tissues: Limited imaging the abdomen is unremarkable. There is no significant adenopathy. No solid organ lesions are present. Paraspinous musculature is unremarkable. Disc levels: L1-2: Negative. L2-3: Negative. L3-4: Negative. L4-5: Mild disc bulging is present without significant stenosis. L5-S1: Negative. IMPRESSION: 1. No evidence for metastatic disease to the thoracic or lumbar spine. 2. 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 3. Right greater than left foraminal narrowing at T10-11 and T11-12 due to facet hypertrophy. 4. Mild disc bulging at L4-5 without significant stenosis. 5. Extensive lung nodules documented on recent CTA of the chest.  Electronically Signed   By: San Morelle M.D.   On: 08/06/2021 12:00   CT Angio Chest/Abd/Pel for Dissection W and/or Wo Contrast  Result Date: 08/04/2021 CLINICAL DATA:  Acute aortic syndrome (AAS) suspected Acute onset of back pain. Patient with history of stage IV colon cancer. EXAM: CT ANGIOGRAPHY CHEST, ABDOMEN AND PELVIS TECHNIQUE: Non-contrast CT of the chest was initially obtained. Multidetector CT imaging through the chest, abdomen and pelvis was performed using the standard protocol during bolus administration of intravenous contrast. Multiplanar reconstructed images and MIPs were obtained and reviewed to evaluate the vascular anatomy. RADIATION DOSE REDUCTION: This exam was performed according to the departmental dose-optimization program which includes automated exposure control, adjustment of the mA and/or kV according to patient size and/or use of iterative reconstruction technique. CONTRAST:  140mL OMNIPAQUE IOHEXOL 350 MG/ML SOLN COMPARISON:  Chest radiograph earlier today. Recent staging abdominopelvic CT 07/16/2021 FINDINGS: CTA CHEST FINDINGS Cardiovascular: The thoracic aorta is normal in caliber. No aortic dissection, aneurysm, acute aortic syndrome. No aortic hematoma noncontrast exam. No significant atherosclerosis. No periaortic stranding. Right chest port in place. Exam not tailored for evaluation of pulmonary arteries, there is minimal opacification of the pulmonary arteries on the current exam. Heart is normal in size. Mediastinum/Nodes: 2 cm enlarged right hilar lymph node is new from prior exam. There is small additional mediastinal lymph nodes are not enlarged by size criteria. Patulous distal esophagus. Lungs/Pleura: Pulmonary metastatic disease with multiple pulmonary nodules. Right lower lobe nodule measures 3.5 x 3.7 cm, series 6, image 38, previously 2.8 x 2.7 cm. Paramediastinal left upper lobe nodule measures 2.8 x 2.3 cm, series 6, image 33, previously 2.2 x 1.7 cm.  Anterior right upper lobe nodule measures 1.9 x 2 cm, series 6, image 26, previously 1.7 x 1.4 cm. Many of the additional pulmonary nodules are also larger than on prior exam. No airspace consolidation. Trace pleural thickening without significant effusion. Musculoskeletal: No lytic or blastic osseous lesions. No acute osseous findings to explain pain. No subcutaneous soft tissue abnormalities. Review of the MIP images confirms the above findings. CTA ABDOMEN AND PELVIS FINDINGS VASCULAR Aorta: Normal caliber aorta without aneurysm, dissection, vasculitis or significant stenosis. Mild calcified atherosclerosis. Celiac: Patent without evidence of aneurysm, dissection, vasculitis or significant stenosis. SMA: Patent without evidence of aneurysm, dissection, vasculitis or significant stenosis. Renals: 2 right renal arteries with small artery supplying the upper pole. Single  left renal artery with early branching. All renal arteries are patent IMA: Patent. Inflow: Patent without evidence of aneurysm, dissection, vasculitis or significant stenosis. Veins: Not well assessed on this arterial phase exam. Review of the MIP images confirms the above findings. NON-VASCULAR Hepatobiliary: Patient's known hepatic metastatic disease is not well assessed on this arterial phase exam. Confluent posterior right lobe liver lesion measures approximately 11.7 x 10.1 cm, unchanged from prior exam. Confluent lesion in the inferior right hepatic lobe is difficult to measure. Many of the additional liver lesions are not well-defined on this arterial phase exam. Gallbladder is partially distended. There is no gallstone or gallbladder wall thickening, however there is mild no common bile duct dilatation. Stranding of the pericholecystic fat. Pancreas: No ductal dilatation or inflammation. No evidence of pancreatic mass. Spleen: Splenomegaly is again seen. The spleen measures 17.9 x 6.0 x 10.8 cm (volume = 610 cm^3), previous splenic volume  was 943 cc. Normal splenic arterial enhancement. Adrenals/Urinary Tract: No adrenal nodules. There is no hydronephrosis. Moderate bilateral perinephric edema, new in the interim. Small amount of fluid tracks into the anterior pararenal spaces and retroperitoneum. No visualized renal calculi. Urinary bladder is decompressed, no obvious bladder wall thickening. Stomach/Bowel: Lack of enteric contrast and arterial phase imaging limits detailed assessment. May be a tiny hiatal hernia. The stomach is decompressed. There is no small bowel obstruction or evidence of small bowel inflammation. Normal appendix. Small to moderate colonic stool burden. No colonic wall thickening or colonic inflammation. Lymphatic: No enlarged abdominopelvic lymph nodes. Reproductive: No acute findings. Other: No ascites. No evidence of omental thickening. There is a tiny fat containing umbilical hernia. Musculoskeletal: No lytic or blastic osseous lesions. No acute osseous findings. Review of the MIP images confirms the above findings. IMPRESSION: 1. No aortic dissection or acute aortic abnormality. 2. Progression in thoracic metastatic disease with increased size of multiple bilateral pulmonary nodules. There is a new enlarged right hilar lymph node. 3. Patient's known hepatic metastatic disease is less well-defined on this arterial phase exam, confluent hepatic metastasis are grossly similar. 4. Moderate bilateral perinephric edema with trace fluid tracking into the anterior pararenal spaces and retroperitoneum. This is nonspecific, and there are no specific findings of pyelonephritis on this arterial phase exam. 5. Splenomegaly. Electronically Signed   By: Keith Rake M.D.   On: 08/04/2021 23:38     LOS: 0 days   Antonieta Pert, MD [facility]  08/06/2021, 1:46 PM

## 2021-08-06 NOTE — Assessment & Plan Note (Signed)
See #1 °

## 2021-08-06 NOTE — Progress Notes (Signed)
°   08/06/21 1816  Assess: MEWS Score  Temp 98 F (36.7 C)  BP (!) 147/95  Pulse Rate (!) 118  Resp 18  SpO2 97 %  O2 Device Room Air  Assess: MEWS Score  MEWS Temp 0  MEWS Systolic 0  MEWS Pulse 2  MEWS RR 0  MEWS LOC 0  MEWS Score 2  MEWS Score Color Yellow  Assess: if the MEWS score is Yellow or Red  Were vital signs taken at a resting state? Yes  Focused Assessment No change from prior assessment  Does the patient meet 2 or more of the SIRS criteria? Yes  Does the patient have a confirmed or suspected source of infection? No  MEWS guidelines implemented *See Row Information* No, vital signs rechecked  Treat  MEWS Interventions Administered prn meds/treatments  Pain Scale 0-10  Pain Score 6  Faces Pain Scale 2  Pain Type Acute pain  Pain Location Back  Pain Descriptors / Indicators Aching  Pain Frequency Intermittent  Pain Onset On-going  Patients Stated Pain Goal 2  Pain Intervention(s) Medication (See eMAR)  Multiple Pain Sites No  Notify: Charge Nurse/RN  Name of Charge Nurse/RN Notified Monica RN  Date Charge Nurse/RN Notified 08/06/21  Time Charge Nurse/RN Notified 1850  Document  Patient Outcome Other (Comment) (will continue to monitor)  Assess: SIRS CRITERIA  SIRS Temperature  0  SIRS Pulse 1  SIRS Respirations  0  SIRS WBC 0  SIRS Score Sum  1

## 2021-08-06 NOTE — Assessment & Plan Note (Signed)
BP stable.  Monitor

## 2021-08-06 NOTE — Assessment & Plan Note (Addendum)
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 Was on Chemo-Sotorasib/panitumumab on theCodebreakstudy beginning 04/01/2021.CTs 1/31-and large lung lesion increased liver lesions, no new site of metastasis.  Continued on same chemo.Oncology following closely and planning to stop Sotorasib and panitumumab Onco disscussed standard treatment with Lonsurf/Avastin versus referral for clinical trial. He will cont to follow outpatient with oncology for future treatment.

## 2021-08-07 ENCOUNTER — Other Ambulatory Visit: Payer: Self-pay | Admitting: *Deleted

## 2021-08-07 ENCOUNTER — Telehealth: Payer: Self-pay | Admitting: Oncology

## 2021-08-07 ENCOUNTER — Encounter: Payer: Self-pay | Admitting: *Deleted

## 2021-08-07 DIAGNOSIS — C7801 Secondary malignant neoplasm of right lung: Secondary | ICD-10-CM | POA: Diagnosis not present

## 2021-08-07 DIAGNOSIS — C7802 Secondary malignant neoplasm of left lung: Secondary | ICD-10-CM | POA: Diagnosis not present

## 2021-08-07 DIAGNOSIS — C187 Malignant neoplasm of sigmoid colon: Secondary | ICD-10-CM

## 2021-08-07 LAB — GLUCOSE, CAPILLARY: Glucose-Capillary: 144 mg/dL — ABNORMAL HIGH (ref 70–99)

## 2021-08-07 MED ORDER — OXYCODONE HCL 5 MG PO TABS: 5.0000 mg | ORAL_TABLET | Freq: Four times a day (QID) | ORAL | 0 refills | Status: AC | PRN

## 2021-08-07 MED ORDER — DOCUSATE SODIUM 100 MG PO CAPS
100.0000 mg | ORAL_CAPSULE | Freq: Every day | ORAL | Status: DC
  Administered 2021-08-07: 100 mg via ORAL
  Filled 2021-08-07: qty 1

## 2021-08-07 MED ORDER — DOCUSATE SODIUM 100 MG PO CAPS: 100.0000 mg | ORAL_CAPSULE | Freq: Every day | ORAL | 0 refills | Status: AC

## 2021-08-07 MED ORDER — OXYCODONE HCL 5 MG PO TABS: 5.0000 mg | ORAL_TABLET | ORAL | Status: DC | PRN

## 2021-08-07 MED ORDER — HEPARIN SOD (PORK) LOCK FLUSH 100 UNIT/ML IV SOLN
INTRAVENOUS | Status: AC
  Filled 2021-08-07: qty 5

## 2021-08-07 MED ORDER — POLYETHYLENE GLYCOL 3350 17 G PO PACK: 17.0000 g | PACK | Freq: Every day | ORAL | 0 refills | Status: AC

## 2021-08-07 MED ORDER — CYCLOBENZAPRINE HCL 5 MG PO TABS: 5.0000 mg | ORAL_TABLET | Freq: Three times a day (TID) | ORAL | 0 refills | Status: AC | PRN

## 2021-08-07 MED ORDER — HEPARIN SOD (PORK) LOCK FLUSH 100 UNIT/ML IV SOLN
500.0000 [IU] | Freq: Once | INTRAVENOUS | Status: DC
  Filled 2021-08-07: qty 5

## 2021-08-07 NOTE — Progress Notes (Signed)
Emailed referral order, demographics and chart information for referral to Dr. Clelia Croft at [facility] per Dr. Benay Spice request. Requested appointment as "urgent". Email: [email or web address] Notified radiology to push over images.

## 2021-08-07 NOTE — Discharge Planning (Signed)
Oncology Discharge Planning Note  [facility] [place] Address: [address] [place], [place], [place] [address] Hours of Operation:  [facility] Polio, Monday - Friday  Clinic Contact Information:  [phone]) [phone]  Oncology Care Team: Medical Oncologist:  Dr. Betsy Coder  Patient Details: Name:  Dan Bell, Dan Bell MRN:   [id number] DOB:   June 07, 1971 Reason for Current Admission: Cancer, metastatic to lung [facility])  Discharge Planning Narrative: Notification of admission received by Dr. Benay Spice for [name].  Discharge follow-up appointments for oncology are being arranged for 08/12/21. Upon discharge from the hospital, hematology/oncology's post discharge plan of care for the outpatient setting is: F/U on 2/27 and refer to Dr. Margarette Canada team at [facility] will be called within two business days after discharge to review hematology/oncology's plan of care for full understanding.    Outpatient Oncology Specific Care Only: Oncology appointment transportation needs addressed?:  yes Oncology medication management for symptom management addressed?:  yes Chemo Alert Card reviewed?:  yes Immunotherapy Alert Card reviewed?:  not applicable

## 2021-08-07 NOTE — Discharge Instructions (Signed)
PER MEDICAL ONCOLOGY: Follow up with Dr. Benay Spice on 08/12/21 for lab/port flush/office visit. Office will contact you with the exact time. Please call for any questions or unrelieved pain at [phone]

## 2021-08-07 NOTE — Progress Notes (Signed)
IP PROGRESS NOTE  Subjective:  Mr. Specht reports partial improvement in pain.  The pain remains chiefly in the right upper back, but is present in the lower back at times.  The lower back pain is chiefly present with ambulation.  He had an episode of nausea last night.  He is constipated.  Objective: Vital signs in last 24 hours: Blood pressure 140/84, pulse 95, temperature 98.2 F (36.8 C), temperature source Oral, resp. rate 16, height _0  (1.778 m), weight 230 lb (104.3 kg), SpO2 94 %.  Intake/Output from previous day: 02/21 0701 - 02/22 0700 In: 1440 [P.O.:1440] Out: -   Physical Exam:  HEENT: No thrush Abdomen: Nontender, no hepatosplenomegaly Vascular: No leg edema Skin: Acne type rash over the trunk Musculoskeletal: No spine tenderness  Portacath/PICC-without erythema  Lab Results: Recent Labs    08/04/21 2040 08/06/21 0532  WBC 10.2 7.3  HGB 14.8 13.0  HCT 44.6 40.7  PLT 159 129*    BMET Recent Labs    08/05/21 1547 08/06/21 0532  NA 136 133*  K 4.2 4.7  CL 106 104  CO2 23 23  GLUCOSE 184* 153*  BUN 19 20  CREATININE 1.23 1.31*  CALCIUM 8.6* 8.7*    Lab Results  Component Value Date   CEA1 7.53 (H) 07/22/2021    Studies/Results: MR THORACIC SPINE W WO CONTRAST  Result Date: 08/06/2021 CLINICAL DATA:  Mid back pain. Metastatic colon cancer with disease progression. Lung metastases. EXAM: MRI THORACIC AND LUMBAR SPINE WITHOUT AND WITH CONTRAST TECHNIQUE: Multiplanar and multiecho pulse sequences of the thoracic and lumbar spine were obtained without and with intravenous contrast. CONTRAST:  43m GADAVIST GADOBUTROL 1 MMOL/ML IV SOLN COMPARISON:  CT a chest, abdomen, and pelvis 08/04/2021 FINDINGS: MRI THORACIC SPINE FINDINGS Alignment:  No significant listhesis is present. Vertebrae: Nonenhancing hemangioma is at T6 and T11 measure 7 and 11 mm respectively. No in hand sing lesions are present in the thoracic spine. Cord:  Normal signal and  morphology. Paraspinal and other soft tissues: Multiple bilateral lung nodules are again noted. 4 cm lung mass is present at the right lung base. Asymmetric right pleural effusion is present. Paraspinous soft tissues are unremarkable. A nonenhancing cystic lesion is present in the left T8-9 interspace, likely perineural root sleeve cyst. Disc levels: No significant central disc protrusion or central stenosis is present. Right greater than left foraminal narrowing at T10-11 is due to facet hypertrophy. Mild foraminal narrowing is present bilaterally at T11-12 due to facet disease. The central canal and foramina are otherwise patent. MRI LUMBAR SPINE FINDINGS Segmentation: 5 non rib-bearing lumbar type vertebral bodies are present. The lowest fully formed vertebral body is L5. Alignment:  No significant listhesis is present. Vertebrae: Marrow signal and vertebral body heights are normal. No enhancing lesions are present. Conus medullaris: Extends to the L1-2 level and appears normal. Paraspinal and other soft tissues: Limited imaging the abdomen is unremarkable. There is no significant adenopathy. No solid organ lesions are present. Paraspinous musculature is unremarkable. Disc levels: L1-2: Negative. L2-3: Negative. L3-4: Negative. L4-5: Mild disc bulging is present without significant stenosis. L5-S1: Negative. IMPRESSION: 1. No evidence for metastatic disease to the thoracic or lumbar spine. 2. 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 3. Right greater than left foraminal narrowing at T10-11 and T11-12 due to facet hypertrophy. 4. Mild disc bulging at L4-5 without significant stenosis. 5. Extensive lung nodules documented on recent CTA of the chest. Electronically Signed   By: CSan Morelle  M.D.   On: 08/06/2021 12:00   MR Lumbar Spine W Wo Contrast  Result Date: 08/06/2021 CLINICAL DATA:  Mid back pain. Metastatic colon cancer with disease progression. Lung metastases. EXAM: MRI THORACIC AND LUMBAR SPINE  WITHOUT AND WITH CONTRAST TECHNIQUE: Multiplanar and multiecho pulse sequences of the thoracic and lumbar spine were obtained without and with intravenous contrast. CONTRAST:  90m GADAVIST GADOBUTROL 1 MMOL/ML IV SOLN COMPARISON:  CT a chest, abdomen, and pelvis 08/04/2021 FINDINGS: MRI THORACIC SPINE FINDINGS Alignment:  No significant listhesis is present. Vertebrae: Nonenhancing hemangioma is at T6 and T11 measure 7 and 11 mm respectively. No in hand sing lesions are present in the thoracic spine. Cord:  Normal signal and morphology. Paraspinal and other soft tissues: Multiple bilateral lung nodules are again noted. 4 cm lung mass is present at the right lung base. Asymmetric right pleural effusion is present. Paraspinous soft tissues are unremarkable. A nonenhancing cystic lesion is present in the left T8-9 interspace, likely perineural root sleeve cyst. Disc levels: No significant central disc protrusion or central stenosis is present. Right greater than left foraminal narrowing at T10-11 is due to facet hypertrophy. Mild foraminal narrowing is present bilaterally at T11-12 due to facet disease. The central canal and foramina are otherwise patent. MRI LUMBAR SPINE FINDINGS Segmentation: 5 non rib-bearing lumbar type vertebral bodies are present. The lowest fully formed vertebral body is L5. Alignment:  No significant listhesis is present. Vertebrae: Marrow signal and vertebral body heights are normal. No enhancing lesions are present. Conus medullaris: Extends to the L1-2 level and appears normal. Paraspinal and other soft tissues: Limited imaging the abdomen is unremarkable. There is no significant adenopathy. No solid organ lesions are present. Paraspinous musculature is unremarkable. Disc levels: L1-2: Negative. L2-3: Negative. L3-4: Negative. L4-5: Mild disc bulging is present without significant stenosis. L5-S1: Negative. IMPRESSION: 1. No evidence for metastatic disease to the thoracic or lumbar spine.  2. 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 3. Right greater than left foraminal narrowing at T10-11 and T11-12 due to facet hypertrophy. 4. Mild disc bulging at L4-5 without significant stenosis. 5. Extensive lung nodules documented on recent CTA of the chest. Electronically Signed   By: CSan MorelleM.D.   On: 08/06/2021 12:00    Medications: I have reviewed the patient's current medications.  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 and CDX2 positive FOLFOX plus bevacizumab, cycle 1 08/24/2019, 11 total cycles, oxaliplatin held with cycle 11 secondary to a reaction with cycle 10 FOLFOX plus bevacizumab discontinued after cycle 19 secondary to disease progression FOLFIRI plus bevacizumab beginning 06/20/2020, status post 12 cycles Disease progression on CT 02/11/2021-enlarging lung nodules and liver lesions CEA increased 02/13/2021 CTs 03/11/2021-bulky hypodense liver metastases, slightly progressive, enlargement of bilateral lung nodules, slight enlargement of mediastinal nodes, splenomegaly and gastroesophageal varices Sotorasib/panitumumab on the Codebreak study beginning 04/01/2021 CTs 05/21/2021-decrease size of lung and liver metastases, no new lesions CTs 07/16/2021-new and enlarged lung lesions, increased size of liver lesions, no new lesions, no new site of metastatic disease Sotorasib/panitumumab continued to 07/23/2021     Type 2 diabetes Left lower extremity DVT 2018 Oxaliplatin reaction with cycle 10 Skin rash most likely related to Panitumumab,  grade 2 04/15/2021 Thrombocytopenia Hypomagnesemia secondary to panitumumab Admission 08/05/2021 with new onset severe back pain MRI thoracic and lumbar spine 08/06/2021-negative for metastatic disease    Dan Bell has metastatic colon cancer.  He was admitted with back pain.  There is no clear explanation for the pain on review of the admission CT and MRIs.  The pain is partially improved.  I suspect the pain could be related to pleural disease in the right chest, or the liver metastases.  He will continue oxycodone for pain.  He is stable for discharge if he is ambulatory and the pain is controlled with oxycodone.   Recommendations:  Continue oxycodone as needed for pain Stop doxycycline Continue MiraLAX, add stool softener as needed for constipation Outpatient follow-up to be scheduled at the Cancer center   LOS: 1 day   Betsy Coder, MD   08/07/2021, 8:15 AM

## 2021-08-07 NOTE — Plan of Care (Signed)

## 2021-08-07 NOTE — Discharge Summary (Signed)
Physician Discharge Summary   Patient: MARCELLOUS Bell MRN: [id number] DOB: Oct 31, 1970  Admit date:     08/04/2021  Discharge date: 08/07/21  Discharge Physician: Antonieta Pert   PCP: Crista Elliot, PA-C   Recommendations at discharge:   Follow-up with hematology for further management of your metastatic disease     Hospital Course: 51 y.o. male with medical history significant of colon CA w/ liver/lung mets-being treated with sotorasib and panitumumab, DM2, HTN presented with back pain and dyspnea. His imaging shows worsening metastatic disease, but no MSK involvement. No PNA. He's on a clinical trial right now. In the ED,CT angio negative for PE but showed liver mets appear larger, patient was admitted for pain control oncology was consulted.Dr. Ammie Dalton advised discontinue sotorasib and panitumumab, pain management narcotics,, consider bone scan or MRI for persistent severe back pain for further evaluation of hematuria. UA rbc 6-10, wbc 0-5. Hba1c 7.1, mildly up creat at 1.2-1.3- baseline.  With ongoing back pain underwent MRI of thoracic and lumbar spine no metastasis no acute significant finding.  Patient was treated with pain management, at this time clinically stable he is being discharged on pain medication and stool softener.  he will continue to follow-up with oncology as outpatient  Discharge Diagnoses: Principal Problem:   Cancer, metastatic to lung [facility]) Active Problems:   Cancer of sigmoid colon ([place])   DM2 (diabetes mellitus, type 2) ([place])   HTN (hypertension)   Back pain   Intractable pain   Hypomagnesemia  * Cancer, metastatic to lung [facility])- (present on admission) Sigmoid colon cancer with bulky hypodense liver mets on CT 9/26 slightly progressive. Was on Chemo-Sotorasib/panitumumab on the [place] study beginning 04/01/2021.CTs 1/31-and large lung lesion increased liver lesions, no new site of metastasis.  Continued on same chemo.Oncology following closely and  planning to stop Sotorasib and panitumumab Onco disscussed standard treatment with Lonsurf/Avastin versus referral for clinical trial. He will cont to follow outpatient with oncology for future treatment.  Hypomagnesemia Repleted with IV and p.o.    Intractable pain- (present on admission) See back pain  Back pain Patient with intractable pain, underwent MRI lumbar and thoracic spine see report below.  At this time plan is to continue with oxycodone pain control muscle accident and stool softener and outpatient follow-up with hematology oncology.  1.No evidence for metastatic disease to the thoracic or lumbar spine. 2. 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 3. Right greater than left foraminal narrowing at T10-11 and T11-12 due to facet hypertrophy. 4. Mild disc bulging at L4-5 without significant stenosis. 5. Extensive lung nodules documented on recent CTA of the chest.  HTN (hypertension) BP stable.  Monitor  DM2 (diabetes mellitus, type 2) ([place]) Blood sugar well controlled.resume home meds. Recent Labs  Lab 08/05/21 1420 08/05/21 1643 08/06/21 0752 08/06/21 1219 08/06/21 1813 08/06/21 1956 08/07/21 0730  GLUCAP  --    < > 171* 165* 209* 228* 144*  HGBA1C 7.1*  --   --   --   --   --   --    < > = values in this interval not displayed.    Cancer of sigmoid colon ([place])- (present on admission) See #1     Pain control - [place] Controlled Substance Reporting System database was reviewed. and patient was instructed, not to drive, operate heavy machinery, perform activities at heights, swimming or participation in water activities or provide baby-sitting services while on Pain, Sleep and Anxiety Medications; until their outpatient Physician has advised to  do so again. Also recommended to not to take more than prescribed Pain, Sleep and Anxiety Medications.   Consultants: Hematology Procedures performed: MRI L and T-spine Disposition: Home Diet recommendation:   Diet Orders (From admission, onward)     Start     Ordered   08/05/21 1503  Diet Carb Modified Fluid consistency: Thin; Room service appropriate? Yes  Diet effective now       Question Answer Comment  Diet-HS Snack? Nothing   Calorie Level Medium 1600-2000   Fluid consistency: Thin   Room service appropriate? Yes      08/05/21 1502             DISCHARGE MEDICATION: Allergies as of 08/07/2021       Reactions   Empagliflozin Other (See Comments)   GI upset   Fosaprepitant Other (See Comments)   Flushing   Oxaliplatin Other (See Comments)   Facial flush, short of breath        Medication List     STOP taking these medications    Basaglar KwikPen 100 UNIT/ML   doxycycline 100 MG capsule Commonly known as: VIBRAMYCIN       TAKE these medications    acetaminophen 500 MG tablet Commonly known as: TYLENOL Take 1,000 mg by mouth every 6 (six) hours as needed for headache (pain).   cyclobenzaprine 5 MG tablet Commonly known as: FLEXERIL Take 1 tablet (5 mg total) by mouth 3 (three) times daily as needed for muscle spasms.   docusate sodium 100 MG capsule Commonly known as: COLACE Take 1 capsule (100 mg total) by mouth daily.   FreeStyle Libre 14 Day Sensor Misc SMARTSIG:1 Each Topical Every 2 Weeks   insulin lispro 100 UNIT/ML KwikPen Commonly known as: HUMALOG Inject 5-14 Units into the skin 3 (three) times daily as needed (CBG >200).   Investigational Sotorasib [facility] [id number]) 120 MG tablet Take 8 tablets (960 mg total) by mouth daily. Take sotorasib dose (all tablets at the same time) with or without food at approximately the same time every day. The sotorasib dose should not be taken more than 2 hours earlier than the scheduled time. The sotorasib dose should not be taken more than 6 hours after the dosing time.   metFORMIN 1000 MG tablet Commonly known as: GLUCOPHAGE Take 1,000 mg by mouth 2 (two) times daily.   oxyCODONE 5 MG immediate release  tablet Commonly known as: Oxy IR/ROXICODONE Take 1 tablet (5 mg total) by mouth every 6 (six) hours as needed for up to 5 days for moderate pain.   PEN NEEDLES 31GX5/16" 31G X 8 MM Misc Needles for Basaglar Kwik pens   B-D UF III MINI PEN NEEDLES 31G X 5 MM Misc Generic drug: Insulin Pen Needle USE TO GIVE INSULIN THREE TIMES DAILY   B-D UF III MINI PEN NEEDLES 31G X 5 MM Misc Generic drug: Insulin Pen Needle Inject into the skin.   polyethylene glycol 17 g packet Commonly known as: MIRALAX / GLYCOLAX Take 17 g by mouth daily. Start taking on: August 08, 2021   prochlorperazine 10 MG tablet Commonly known as: COMPAZINE Take 10 mg by mouth daily as needed for nausea or vomiting.        Follow-up Information     Crista Elliot, PA-C Follow up in 1 week(s).   Specialty: Family Medicine Contact information: [place] [address] [phone]         Ladell Pier, MD Follow up in 1  week(s).   Specialty: Oncology Contact information: [place] [address] [phone]                 Discharge Exam: Danley Danker Weights   08/04/21 2030  Weight: 104.3 kg   Condition at discharge: Stable  The results of significant diagnostics from this hospitalization (including imaging, microbiology, ancillary and laboratory) are listed below for reference.   Imaging Studies: DG Chest 2 View  Result Date: 08/04/2021 CLINICAL DATA:  Hervey Ard back pain behind shoulder blades. EXAM: CHEST - 2 VIEW COMPARISON:  Chest plain film, dated February 28, 2007 and chest CT, dated July 16, 2021. FINDINGS: A right-sided venous Port-A-Cath is seen with its distal tip overlying the junction of the superior vena cava and right atrium. A 21 mm x 16 mm well-defined radiopaque lung nodule is seen overlying the mid right lung. A 9 mm diameter lung nodule is also seen overlying the medial aspect of the right upper lobe. These correspond to lung  nodules seen within the right lung on the prior chest CT. There is no evidence of acute infiltrate, pleural effusion or pneumothorax. The heart size and mediastinal contours are within normal limits. The visualized skeletal structures are unremarkable. IMPRESSION: 1. Lung nodules within the mid and upper right lung, as described above, consistent with the patient's known history of pulmonary metastasis. 2. No acute infiltrate. Electronically Signed   By: Virgina Norfolk M.D.   On: 08/04/2021 21:18   CT Chest W Contrast  Addendum Date: 07/25/2021   ADDENDUM REPORT: 07/25/2021 13:01 ADDENDUM: RECIST 1.1 Target Lesions: 1. New right lower lobe pulmonary nodule (axial image 92 of series 4) measuring 2.7 x 2.8 cm. 2. New left upper lobe pulmonary nodule (axial image 78 of series 4) measuring 2.2 x 1.7 cm. 3. Previously noted target lesions in the right lobe of the liver have become confluent and are no longer able to be individually delineated. New dominant lesion in the liver (axial image 52 of series 2) measures 11.7 x 10.1 x 10.0 cm as previously described, clearly larger than the prior examination. Electronically Signed   By: Vinnie Langton M.D.   On: 07/25/2021 13:01   Result Date: 07/25/2021 CLINICAL DATA:  51 year old male with history of colon cancer. Originally diagnosed in 2020, with known liver metastasis. Chemotherapy complete. Immunotherapy in progress. Evaluate for response to therapy. EXAM: CT CHEST, ABDOMEN, AND PELVIS WITH CONTRAST TECHNIQUE: Multidetector CT imaging of the chest, abdomen and pelvis was performed following the standard protocol during bolus administration of intravenous contrast. RADIATION DOSE REDUCTION: This exam was performed according to the departmental dose-optimization program which includes automated exposure control, adjustment of the mA and/or kV according to patient size and/or use of iterative reconstruction technique. CONTRAST:  17mL OMNIPAQUE IOHEXOL 300 MG/ML   SOLN COMPARISON:  CT the chest, abdomen and pelvis 05/21/2021. FINDINGS: CT CHEST FINDINGS Cardiovascular: Heart size is normal. There is no significant pericardial fluid, thickening or pericardial calcification. No atherosclerotic calcifications are noted in the thoracic aorta or the coronary arteries. Right internal jugular single-lumen porta cath with tip terminating at the superior cavoatrial junction. Mediastinum/Nodes: No pathologically enlarged mediastinal or hilar lymph nodes. Esophagus is unremarkable in appearance. No axillary lymphadenopathy. Lungs/Pleura: Previously noted left lower lobe pulmonary nodule (axial image 103 of series 4) is stable measuring 10 x 8 mm. However, there are several new nodules throughout the lungs bilaterally. The largest of these is in the right lower lobe medially (axial image 92 of  series 4) measuring 2.7 x 2.8 cm. The largest nodule in the left lung is in the medial aspect of the left upper lobe (axial image 78 of series 4) measuring 2.2 x 1.7 cm. Several other smaller nodules are also noted, largest of which is in the anterior aspect of the right upper lobe (axial image 57 of series 4). No acute consolidative airspace disease. No pleural effusions. Musculoskeletal: There are no aggressive appearing lytic or blastic lesions noted in the visualized portions of the skeleton. CT ABDOMEN PELVIS FINDINGS Hepatobiliary: There are again multiple hypovascular lesions scattered throughout the liver, predominantly in the right lobe of the liver. When compared to the prior study, these appear similar in number, but increased in size compared to the prior examination. As a result, a majority of these lesions have formed into one large confluent mass-like area (axial image 52 of series 2 and coronal image 105 of series 5) which currently measures 11.7 x 10.1 x 10.0 cm. This lesion was previously comprised of several smaller lesions, so direct comparison is difficult in terms of  measurements, however, the lesions comprising this appear clearly larger individually as well. Among the smaller lesions, there also appears slight enlargement, best demonstrated by a lesion in the central aspect of the left lobe of the liver, predominantly in segment 4A (axial image 46 of series 2) which currently measures 4.0 x 1.9 cm (previously 3.3 x 1.8 cm). No new hepatic lesions are confidently identified. No intra or extrahepatic biliary ductal dilatation. Gallbladder is normal in appearance. Pancreas: No pancreatic mass. No pancreatic ductal dilatation. No pancreatic or peripancreatic fluid collections or inflammatory changes. Spleen: Spleen is enlarged measuring 18.2 x 7.0 x 14.8 cm (estimated splenic volume of 943 mL) . Adrenals/Urinary Tract: Bilateral kidneys and adrenal glands are normal in appearance. No hydroureteronephrosis. Urinary bladder is normal in appearance. Stomach/Bowel: Normal appearance of the stomach. No pathologic dilatation of small bowel or colon. Normal appendix. Vascular/Lymphatic: Aortic atherosclerosis, without evidence of aneurysm or dissection in the abdominal or pelvic vasculature. Portal vein is dilated measuring 19 mm in the porta hepatis. No lymphadenopathy noted in the abdomen or pelvis. Reproductive: Prostate gland and seminal vesicles are unremarkable in appearance. Other: No significant volume of ascites.  No pneumoperitoneum. Musculoskeletal: There are no aggressive appearing lytic or blastic lesions noted in the visualized portions of the skeleton. IMPRESSION: 1. Progression of metastatic disease in the thorax with interval development of numerous new pulmonary nodules, as detailed above. 2. Previously noted metastatic disease to the liver also appears progressive, with increased size of pre-existing metastatic lesions, as detailed above. No new hepatic lesions are confidently identified. 3. No new sites of metastatic disease noted elsewhere in the chest, abdomen or  pelvis. 4. Dilatation of the portal vein and splenomegaly; imaging findings indicative of portal venous hypertension. 5. Additional incidental findings, as above. Electronically Signed: By: Vinnie Langton M.D. On: 07/17/2021 05:51   MR THORACIC SPINE W WO CONTRAST  Result Date: 08/06/2021 CLINICAL DATA:  Mid back pain. Metastatic colon cancer with disease progression. Lung metastases. EXAM: MRI THORACIC AND LUMBAR SPINE WITHOUT AND WITH CONTRAST TECHNIQUE: Multiplanar and multiecho pulse sequences of the thoracic and lumbar spine were obtained without and with intravenous contrast. CONTRAST:  55mL GADAVIST GADOBUTROL 1 MMOL/ML IV SOLN COMPARISON:  CT a chest, abdomen, and pelvis 08/04/2021 FINDINGS: MRI THORACIC SPINE FINDINGS Alignment:  No significant listhesis is present. Vertebrae: Nonenhancing hemangioma is at T6 and T11 measure 7 and 11 mm respectively. No  in hand sing lesions are present in the thoracic spine. Cord:  Normal signal and morphology. Paraspinal and other soft tissues: Multiple bilateral lung nodules are again noted. 4 cm lung mass is present at the right lung base. Asymmetric right pleural effusion is present. Paraspinous soft tissues are unremarkable. A nonenhancing cystic lesion is present in the left T8-9 interspace, likely perineural root sleeve cyst. Disc levels: No significant central disc protrusion or central stenosis is present. Right greater than left foraminal narrowing at T10-11 is due to facet hypertrophy. Mild foraminal narrowing is present bilaterally at T11-12 due to facet disease. The central canal and foramina are otherwise patent. MRI LUMBAR SPINE FINDINGS Segmentation: 5 non rib-bearing lumbar type vertebral bodies are present. The lowest fully formed vertebral body is L5. Alignment:  No significant listhesis is present. Vertebrae: Marrow signal and vertebral body heights are normal. No enhancing lesions are present. Conus medullaris: Extends to the L1-2 level and  appears normal. Paraspinal and other soft tissues: Limited imaging the abdomen is unremarkable. There is no significant adenopathy. No solid organ lesions are present. Paraspinous musculature is unremarkable. Disc levels: L1-2: Negative. L2-3: Negative. L3-4: Negative. L4-5: Mild disc bulging is present without significant stenosis. L5-S1: Negative. IMPRESSION: 1. No evidence for metastatic disease to the thoracic or lumbar spine. 2. 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 3. Right greater than left foraminal narrowing at T10-11 and T11-12 due to facet hypertrophy. 4. Mild disc bulging at L4-5 without significant stenosis. 5. Extensive lung nodules documented on recent CTA of the chest. Electronically Signed   By: San Morelle M.D.   On: 08/06/2021 12:00   MR Lumbar Spine W Wo Contrast  Result Date: 08/06/2021 CLINICAL DATA:  Mid back pain. Metastatic colon cancer with disease progression. Lung metastases. EXAM: MRI THORACIC AND LUMBAR SPINE WITHOUT AND WITH CONTRAST TECHNIQUE: Multiplanar and multiecho pulse sequences of the thoracic and lumbar spine were obtained without and with intravenous contrast. CONTRAST:  72mL GADAVIST GADOBUTROL 1 MMOL/ML IV SOLN COMPARISON:  CT a chest, abdomen, and pelvis 08/04/2021 FINDINGS: MRI THORACIC SPINE FINDINGS Alignment:  No significant listhesis is present. Vertebrae: Nonenhancing hemangioma is at T6 and T11 measure 7 and 11 mm respectively. No in hand sing lesions are present in the thoracic spine. Cord:  Normal signal and morphology. Paraspinal and other soft tissues: Multiple bilateral lung nodules are again noted. 4 cm lung mass is present at the right lung base. Asymmetric right pleural effusion is present. Paraspinous soft tissues are unremarkable. A nonenhancing cystic lesion is present in the left T8-9 interspace, likely perineural root sleeve cyst. Disc levels: No significant central disc protrusion or central stenosis is present. Right greater than left  foraminal narrowing at T10-11 is due to facet hypertrophy. Mild foraminal narrowing is present bilaterally at T11-12 due to facet disease. The central canal and foramina are otherwise patent. MRI LUMBAR SPINE FINDINGS Segmentation: 5 non rib-bearing lumbar type vertebral bodies are present. The lowest fully formed vertebral body is L5. Alignment:  No significant listhesis is present. Vertebrae: Marrow signal and vertebral body heights are normal. No enhancing lesions are present. Conus medullaris: Extends to the L1-2 level and appears normal. Paraspinal and other soft tissues: Limited imaging the abdomen is unremarkable. There is no significant adenopathy. No solid organ lesions are present. Paraspinous musculature is unremarkable. Disc levels: L1-2: Negative. L2-3: Negative. L3-4: Negative. L4-5: Mild disc bulging is present without significant stenosis. L5-S1: Negative. IMPRESSION: 1. No evidence for metastatic disease to the thoracic or  lumbar spine. 2. 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 3. Right greater than left foraminal narrowing at T10-11 and T11-12 due to facet hypertrophy. 4. Mild disc bulging at L4-5 without significant stenosis. 5. Extensive lung nodules documented on recent CTA of the chest. Electronically Signed   By: San Morelle M.D.   On: 08/06/2021 12:00   CT Abdomen Pelvis W Contrast  Addendum Date: 07/25/2021   ADDENDUM REPORT: 07/25/2021 13:01 ADDENDUM: RECIST 1.1 Target Lesions: 1. New right lower lobe pulmonary nodule (axial image 92 of series 4) measuring 2.7 x 2.8 cm. 2. New left upper lobe pulmonary nodule (axial image 78 of series 4) measuring 2.2 x 1.7 cm. 3. Previously noted target lesions in the right lobe of the liver have become confluent and are no longer able to be individually delineated. New dominant lesion in the liver (axial image 52 of series 2) measures 11.7 x 10.1 x 10.0 cm as previously described, clearly larger than the prior examination. Electronically  Signed   By: Vinnie Langton M.D.   On: 07/25/2021 13:01   Result Date: 07/25/2021 CLINICAL DATA:  51 year old male with history of colon cancer. Originally diagnosed in 2020, with known liver metastasis. Chemotherapy complete. Immunotherapy in progress. Evaluate for response to therapy. EXAM: CT CHEST, ABDOMEN, AND PELVIS WITH CONTRAST TECHNIQUE: Multidetector CT imaging of the chest, abdomen and pelvis was performed following the standard protocol during bolus administration of intravenous contrast. RADIATION DOSE REDUCTION: This exam was performed according to the departmental dose-optimization program which includes automated exposure control, adjustment of the mA and/or kV according to patient size and/or use of iterative reconstruction technique. CONTRAST:  18mL OMNIPAQUE IOHEXOL 300 MG/ML  SOLN COMPARISON:  CT the chest, abdomen and pelvis 05/21/2021. FINDINGS: CT CHEST FINDINGS Cardiovascular: Heart size is normal. There is no significant pericardial fluid, thickening or pericardial calcification. No atherosclerotic calcifications are noted in the thoracic aorta or the coronary arteries. Right internal jugular single-lumen porta cath with tip terminating at the superior cavoatrial junction. Mediastinum/Nodes: No pathologically enlarged mediastinal or hilar lymph nodes. Esophagus is unremarkable in appearance. No axillary lymphadenopathy. Lungs/Pleura: Previously noted left lower lobe pulmonary nodule (axial image 103 of series 4) is stable measuring 10 x 8 mm. However, there are several new nodules throughout the lungs bilaterally. The largest of these is in the right lower lobe medially (axial image 92 of series 4) measuring 2.7 x 2.8 cm. The largest nodule in the left lung is in the medial aspect of the left upper lobe (axial image 78 of series 4) measuring 2.2 x 1.7 cm. Several other smaller nodules are also noted, largest of which is in the anterior aspect of the right upper lobe (axial image 57 of  series 4). No acute consolidative airspace disease. No pleural effusions. Musculoskeletal: There are no aggressive appearing lytic or blastic lesions noted in the visualized portions of the skeleton. CT ABDOMEN PELVIS FINDINGS Hepatobiliary: There are again multiple hypovascular lesions scattered throughout the liver, predominantly in the right lobe of the liver. When compared to the prior study, these appear similar in number, but increased in size compared to the prior examination. As a result, a majority of these lesions have formed into one large confluent mass-like area (axial image 52 of series 2 and coronal image 105 of series 5) which currently measures 11.7 x 10.1 x 10.0 cm. This lesion was previously comprised of several smaller lesions, so direct comparison is difficult in terms of measurements, however, the lesions comprising this appear  clearly larger individually as well. Among the smaller lesions, there also appears slight enlargement, best demonstrated by a lesion in the central aspect of the left lobe of the liver, predominantly in segment 4A (axial image 46 of series 2) which currently measures 4.0 x 1.9 cm (previously 3.3 x 1.8 cm). No new hepatic lesions are confidently identified. No intra or extrahepatic biliary ductal dilatation. Gallbladder is normal in appearance. Pancreas: No pancreatic mass. No pancreatic ductal dilatation. No pancreatic or peripancreatic fluid collections or inflammatory changes. Spleen: Spleen is enlarged measuring 18.2 x 7.0 x 14.8 cm (estimated splenic volume of 943 mL) . Adrenals/Urinary Tract: Bilateral kidneys and adrenal glands are normal in appearance. No hydroureteronephrosis. Urinary bladder is normal in appearance. Stomach/Bowel: Normal appearance of the stomach. No pathologic dilatation of small bowel or colon. Normal appendix. Vascular/Lymphatic: Aortic atherosclerosis, without evidence of aneurysm or dissection in the abdominal or pelvic vasculature. Portal  vein is dilated measuring 19 mm in the porta hepatis. No lymphadenopathy noted in the abdomen or pelvis. Reproductive: Prostate gland and seminal vesicles are unremarkable in appearance. Other: No significant volume of ascites.  No pneumoperitoneum. Musculoskeletal: There are no aggressive appearing lytic or blastic lesions noted in the visualized portions of the skeleton. IMPRESSION: 1. Progression of metastatic disease in the thorax with interval development of numerous new pulmonary nodules, as detailed above. 2. Previously noted metastatic disease to the liver also appears progressive, with increased size of pre-existing metastatic lesions, as detailed above. No new hepatic lesions are confidently identified. 3. No new sites of metastatic disease noted elsewhere in the chest, abdomen or pelvis. 4. Dilatation of the portal vein and splenomegaly; imaging findings indicative of portal venous hypertension. 5. Additional incidental findings, as above. Electronically Signed: By: Vinnie Langton M.D. On: 07/17/2021 05:51   CT Angio Chest/Abd/Pel for Dissection W and/or Wo Contrast  Result Date: 08/04/2021 CLINICAL DATA:  Acute aortic syndrome (AAS) suspected Acute onset of back pain. Patient with history of stage IV colon cancer. EXAM: CT ANGIOGRAPHY CHEST, ABDOMEN AND PELVIS TECHNIQUE: Non-contrast CT of the chest was initially obtained. Multidetector CT imaging through the chest, abdomen and pelvis was performed using the standard protocol during bolus administration of intravenous contrast. Multiplanar reconstructed images and MIPs were obtained and reviewed to evaluate the vascular anatomy. RADIATION DOSE REDUCTION: This exam was performed according to the departmental dose-optimization program which includes automated exposure control, adjustment of the mA and/or kV according to patient size and/or use of iterative reconstruction technique. CONTRAST:  174mL OMNIPAQUE IOHEXOL 350 MG/ML SOLN COMPARISON:  Chest  radiograph earlier today. Recent staging abdominopelvic CT 07/16/2021 FINDINGS: CTA CHEST FINDINGS Cardiovascular: The thoracic aorta is normal in caliber. No aortic dissection, aneurysm, acute aortic syndrome. No aortic hematoma noncontrast exam. No significant atherosclerosis. No periaortic stranding. Right chest port in place. Exam not tailored for evaluation of pulmonary arteries, there is minimal opacification of the pulmonary arteries on the current exam. Heart is normal in size. Mediastinum/Nodes: 2 cm enlarged right hilar lymph node is new from prior exam. There is small additional mediastinal lymph nodes are not enlarged by size criteria. Patulous distal esophagus. Lungs/Pleura: Pulmonary metastatic disease with multiple pulmonary nodules. Right lower lobe nodule measures 3.5 x 3.7 cm, series 6, image 38, previously 2.8 x 2.7 cm. Paramediastinal left upper lobe nodule measures 2.8 x 2.3 cm, series 6, image 33, previously 2.2 x 1.7 cm. Anterior right upper lobe nodule measures 1.9 x 2 cm, series 6, image 26, previously 1.7 x 1.4  cm. Many of the additional pulmonary nodules are also larger than on prior exam. No airspace consolidation. Trace pleural thickening without significant effusion. Musculoskeletal: No lytic or blastic osseous lesions. No acute osseous findings to explain pain. No subcutaneous soft tissue abnormalities. Review of the MIP images confirms the above findings. CTA ABDOMEN AND PELVIS FINDINGS VASCULAR Aorta: Normal caliber aorta without aneurysm, dissection, vasculitis or significant stenosis. Mild calcified atherosclerosis. Celiac: Patent without evidence of aneurysm, dissection, vasculitis or significant stenosis. SMA: Patent without evidence of aneurysm, dissection, vasculitis or significant stenosis. Renals: 2 right renal arteries with small artery supplying the upper pole. Single left renal artery with early branching. All renal arteries are patent IMA: Patent. Inflow: Patent without  evidence of aneurysm, dissection, vasculitis or significant stenosis. Veins: Not well assessed on this arterial phase exam. Review of the MIP images confirms the above findings. NON-VASCULAR Hepatobiliary: Patient's known hepatic metastatic disease is not well assessed on this arterial phase exam. Confluent posterior right lobe liver lesion measures approximately 11.7 x 10.1 cm, unchanged from prior exam. Confluent lesion in the inferior right hepatic lobe is difficult to measure. Many of the additional liver lesions are not well-defined on this arterial phase exam. Gallbladder is partially distended. There is no gallstone or gallbladder wall thickening, however there is mild no common bile duct dilatation. Stranding of the pericholecystic fat. Pancreas: No ductal dilatation or inflammation. No evidence of pancreatic mass. Spleen: Splenomegaly is again seen. The spleen measures 17.9 x 6.0 x 10.8 cm (volume = 610 cm^3), previous splenic volume was 943 cc. Normal splenic arterial enhancement. Adrenals/Urinary Tract: No adrenal nodules. There is no hydronephrosis. Moderate bilateral perinephric edema, new in the interim. Small amount of fluid tracks into the anterior pararenal spaces and retroperitoneum. No visualized renal calculi. Urinary bladder is decompressed, no obvious bladder wall thickening. Stomach/Bowel: Lack of enteric contrast and arterial phase imaging limits detailed assessment. May be a tiny hiatal hernia. The stomach is decompressed. There is no small bowel obstruction or evidence of small bowel inflammation. Normal appendix. Small to moderate colonic stool burden. No colonic wall thickening or colonic inflammation. Lymphatic: No enlarged abdominopelvic lymph nodes. Reproductive: No acute findings. Other: No ascites. No evidence of omental thickening. There is a tiny fat containing umbilical hernia. Musculoskeletal: No lytic or blastic osseous lesions. No acute osseous findings. Review of the MIP  images confirms the above findings. IMPRESSION: 1. No aortic dissection or acute aortic abnormality. 2. Progression in thoracic metastatic disease with increased size of multiple bilateral pulmonary nodules. There is a new enlarged right hilar lymph node. 3. Patient's known hepatic metastatic disease is less well-defined on this arterial phase exam, confluent hepatic metastasis are grossly similar. 4. Moderate bilateral perinephric edema with trace fluid tracking into the anterior pararenal spaces and retroperitoneum. This is nonspecific, and there are no specific findings of pyelonephritis on this arterial phase exam. 5. Splenomegaly. Electronically Signed   By: Keith Rake M.D.   On: 08/04/2021 23:38    Microbiology: Results for orders placed or performed during the hospital encounter of 08/04/21  Resp Panel by RT-PCR (Flu A&B, Covid) Nasopharyngeal Swab     Status: None   Collection Time: 08/05/21 12:24 AM   Specimen: Nasopharyngeal Swab; Nasopharyngeal(NP) swabs in vial transport medium  Result Value Ref Range Status   SARS Coronavirus 2 by RT PCR NEGATIVE NEGATIVE Final    Comment: (NOTE) SARS-CoV-2 target nucleic acids are NOT DETECTED.  The SARS-CoV-2 RNA is generally detectable in upper respiratory specimens  during the acute phase of infection. The lowest concentration of SARS-CoV-2 viral copies this assay can detect is 138 copies/mL. A negative result does not preclude SARS-Cov-2 infection and should not be used as the sole basis for treatment or other patient management decisions. A negative result may occur with  improper specimen collection/handling, submission of specimen other than nasopharyngeal swab, presence of viral mutation(s) within the areas targeted by this assay, and inadequate number of viral copies(<138 copies/mL). A negative result must be combined with clinical observations, patient history, and epidemiological information. The expected result is  Negative.  Fact Sheet for Patients:  EntrepreneurPulse.com.au  Fact Sheet for Healthcare Providers:  IncredibleEmployment.be  This test is no t yet approved or cleared by the [place] FDA and  has been authorized for detection and/or diagnosis of SARS-CoV-2 by FDA under an Emergency Use Authorization (EUA). This EUA will remain  in effect (meaning this test can be used) for the duration of the COVID-19 declaration under Section 564(b)(1) of the Act, 21 U.S.C.section 360bbb-3(b)(1), unless the authorization is terminated  or revoked sooner.       Influenza A by PCR NEGATIVE NEGATIVE Final   Influenza B by PCR NEGATIVE NEGATIVE Final    Comment: (NOTE) The Xpert Xpress SARS-CoV-2/FLU/RSV plus assay is intended as an aid in the diagnosis of influenza from Nasopharyngeal swab specimens and should not be used as a sole basis for treatment. Nasal washings and aspirates are unacceptable for Xpert Xpress SARS-CoV-2/FLU/RSV testing.  Fact Sheet for Patients: EntrepreneurPulse.com.au  Fact Sheet for Healthcare Providers: IncredibleEmployment.be  This test is not yet approved or cleared by the [place] FDA and has been authorized for detection and/or diagnosis of SARS-CoV-2 by FDA under an Emergency Use Authorization (EUA). This EUA will remain in effect (meaning this test can be used) for the duration of the COVID-19 declaration under Section 564(b)(1) of the Act, 21 U.S.C. section 360bbb-3(b)(1), unless the authorization is terminated or revoked.  Performed at [facility], [address] [place], [place] [address]     Labs: CBC: Recent Labs  Lab 08/04/21 2040 08/06/21 0532  WBC 10.2 7.3  NEUTROABS 6.8 4.3  HGB 14.8 13.0  HCT 44.6 40.7  MCV 84.2 86.8  PLT 159 401*   Basic Metabolic Panel: Recent Labs  Lab 08/04/21 2040 08/05/21 1547 08/06/21 0532 08/06/21 0753  NA 134*  136 133*  --   K 4.1 4.2 4.7  --   CL 104 106 104  --   CO2 21* 23 23  --   GLUCOSE 251* 184* 153*  --   BUN 16 19 20   --   CREATININE 1.37* 1.23 1.31*  --   CALCIUM 8.8* 8.6* 8.7*  --   MG  --   --   --  1.4*   Liver Function Tests: Recent Labs  Lab 08/04/21 2259 08/05/21 1547 08/06/21 0532  AST 17 18 19   ALT 18 17 18   ALKPHOS 184* 161* 171*  BILITOT 1.6* 1.1 1.4*  PROT 6.7 5.9* 5.8*  ALBUMIN 3.3* 3.2* 3.0*   CBG: Recent Labs  Lab 08/06/21 0752 08/06/21 1219 08/06/21 1813 08/06/21 1956 08/07/21 0730  GLUCAP 171* 165* 209* 228* 144*    Discharge time spent:  25 minutes.  Signed: Antonieta Pert, MD [facility] 08/07/2021

## 2021-08-07 NOTE — Telephone Encounter (Signed)
Attempted to contact patient to advised of scheduled appts for this Monday. No answer, so voicemail was left with details if patient calls back just confirm appt time for appt on 2/27.

## 2021-08-08 ENCOUNTER — Telehealth: Payer: Self-pay | Admitting: *Deleted

## 2021-08-08 NOTE — Telephone Encounter (Signed)
Mrs. Caul called to f/u on referral to [facility]. Informed her that [facility] referral coordinator said they can pull all his records up on [place]. Requested demographic information be faxed and they will open an account and call him with appointment, so this was done [phone]. Patient had small BM this am, but not significant enough. He has decided to use colonoscopy prep w/MiraLax and Gatoraid to attempt BM. Took #3 Colace yesterday and 1 so far today. Informed him that he can take up to #4 Colace/day. Continue his current regimen. Reports lower abdominal discomfort from what he feels is lack of BM. Suggested he also try heating pad on his abdomen to help. Still has the dull-sharp shoulder pain, but it is not worse. Trying not to take much oxycodone due to constipation. He does have enough to last till OV with Dr. Benay Spice on 2/27.

## 2021-08-09 ENCOUNTER — Telehealth: Payer: Self-pay | Admitting: *Deleted

## 2021-08-09 ENCOUNTER — Telehealth: Payer: Self-pay | Admitting: Medical Oncology

## 2021-08-09 NOTE — Telephone Encounter (Signed)
Dan Bell was contacted by telephone to verify understanding of discharge instructions status post their most recent discharge from the hospital on the date:  08/07/21.  Inpatient discharge AVS was re-reviewed with patient, along with cancer center appointments.  Verification of understanding for oncology specific follow-up was validated using the Teach Back method.    Transportation to appointments were confirmed for the patient as being self/caregiver.  Dan Bell questions were addressed to their satisfaction upon completion of this post discharge follow-up call for outpatient oncology.  He reports he finally had several good bowel movements and abdominal pain is gone. Still has the dull pain in shoulder/back. Reports walking 30-40 feet and needs to stop and rest due to shortness of breath/cough--resolves after 2 minutes at the most.  Instructed him to call for any worsening SOB or chest pain. Continue the MiraLax 1-2 times/day since he is on narcotics.

## 2021-08-09 NOTE — Telephone Encounter (Signed)
TRIAL: [place] / A Phase 3 Multicenter, Randomized, Openlabel, Active-controlled Study of Sotorasib and Panitumumab Versus Investigators Choice (Trifluridine and Tipiracil, or Regorafenib) for the Treatment of Previously Treated Metastatic Colorectal Cancer Subjects with KRAS p.G12C Mutation.    Outgoing call: 31 Spoke with patient regarding discharge from hospital and to see how he was feeling. Patient states that his pain is mostly controlled, and is around his abdomen now. Dr. Gearldine Shown nurse, Manuela Schwartz, has been in contact with patient since his discharge. Patient reports that Dr. Benay Spice has referred him to [facility] and they are waiting to hear back for an appointment. Patient also reports to continue to be out of breath and states he can walk about thirty feet before becoming SOB. Patient was told that should his breathing worsen or he is feeling worse, that he needs to go to the emergency room, patient gave verbal confirmation that he would.   Patient has a scheduled appointment with Dr. Benay Spice Monday morning 2/27, for hospital discharge follow-up. I informed patient that I will be at that appointment. Patient knows to return all study drug, as well as the ediary.I thanked patient for his time and encouraged him to call in the meantime.   [name], RN, BSN, [facility] Clinical Research Nurse Lead 08/09/2021 3:04 PM

## 2021-08-12 ENCOUNTER — Encounter: Payer: Self-pay | Admitting: *Deleted

## 2021-08-12 ENCOUNTER — Other Ambulatory Visit: Payer: Self-pay

## 2021-08-12 ENCOUNTER — Inpatient Hospital Stay (HOSPITAL_BASED_OUTPATIENT_CLINIC_OR_DEPARTMENT_OTHER): Payer: Managed Care, Other (non HMO) | Admitting: Oncology

## 2021-08-12 ENCOUNTER — Inpatient Hospital Stay: Payer: Managed Care, Other (non HMO)

## 2021-08-12 ENCOUNTER — Encounter: Payer: Self-pay | Admitting: Medical Oncology

## 2021-08-12 ENCOUNTER — Other Ambulatory Visit: Payer: Self-pay | Admitting: *Deleted

## 2021-08-12 VITALS — BP 129/108 | HR 139 | Temp 98.0°F | Resp 18 | Ht 70.0 in | Wt 247.0 lb

## 2021-08-12 DIAGNOSIS — R188 Other ascites: Secondary | ICD-10-CM | POA: Diagnosis present

## 2021-08-12 DIAGNOSIS — C7802 Secondary malignant neoplasm of left lung: Secondary | ICD-10-CM | POA: Diagnosis present

## 2021-08-12 DIAGNOSIS — Z79899 Other long term (current) drug therapy: Secondary | ICD-10-CM

## 2021-08-12 DIAGNOSIS — Z86718 Personal history of other venous thrombosis and embolism: Secondary | ICD-10-CM

## 2021-08-12 DIAGNOSIS — C7801 Secondary malignant neoplasm of right lung: Principal | ICD-10-CM | POA: Diagnosis present

## 2021-08-12 DIAGNOSIS — Z95828 Presence of other vascular implants and grafts: Secondary | ICD-10-CM

## 2021-08-12 DIAGNOSIS — Z20822 Contact with and (suspected) exposure to covid-19: Secondary | ICD-10-CM | POA: Diagnosis present

## 2021-08-12 DIAGNOSIS — R0600 Dyspnea, unspecified: Secondary | ICD-10-CM | POA: Diagnosis not present

## 2021-08-12 DIAGNOSIS — Z7984 Long term (current) use of oral hypoglycemic drugs: Secondary | ICD-10-CM

## 2021-08-12 DIAGNOSIS — C187 Malignant neoplasm of sigmoid colon: Secondary | ICD-10-CM | POA: Diagnosis present

## 2021-08-12 DIAGNOSIS — E669 Obesity, unspecified: Secondary | ICD-10-CM | POA: Diagnosis present

## 2021-08-12 DIAGNOSIS — I82220 Acute embolism and thrombosis of inferior vena cava: Secondary | ICD-10-CM | POA: Diagnosis present

## 2021-08-12 DIAGNOSIS — J910 Malignant pleural effusion: Secondary | ICD-10-CM | POA: Diagnosis present

## 2021-08-12 DIAGNOSIS — Z6836 Body mass index (BMI) 36.0-36.9, adult: Secondary | ICD-10-CM

## 2021-08-12 DIAGNOSIS — E876 Hypokalemia: Secondary | ICD-10-CM

## 2021-08-12 DIAGNOSIS — C787 Secondary malignant neoplasm of liver and intrahepatic bile duct: Secondary | ICD-10-CM | POA: Diagnosis present

## 2021-08-12 DIAGNOSIS — E119 Type 2 diabetes mellitus without complications: Secondary | ICD-10-CM | POA: Diagnosis present

## 2021-08-12 DIAGNOSIS — I10 Essential (primary) hypertension: Secondary | ICD-10-CM | POA: Diagnosis present

## 2021-08-12 DIAGNOSIS — Z794 Long term (current) use of insulin: Secondary | ICD-10-CM

## 2021-08-12 DIAGNOSIS — Z888 Allergy status to other drugs, medicaments and biological substances status: Secondary | ICD-10-CM

## 2021-08-12 LAB — CMP (CANCER CENTER ONLY)
ALT: 11 U/L (ref 0–44)
AST: 16 U/L (ref 15–41)
Albumin: 1.9 g/dL — ABNORMAL LOW (ref 3.5–5.0)
Alkaline Phosphatase: 148 U/L — ABNORMAL HIGH (ref 38–126)
Anion gap: 9 (ref 5–15)
BUN: 12 mg/dL (ref 6–20)
CO2: 15 mmol/L — ABNORMAL LOW (ref 22–32)
Calcium: 5.1 mg/dL — CL (ref 8.9–10.3)
Chloride: 117 mmol/L — ABNORMAL HIGH (ref 98–111)
Creatinine: 0.57 mg/dL — ABNORMAL LOW (ref 0.61–1.24)
GFR, Estimated: >60 mL/min (ref >60)
Glucose, Bld: 155 mg/dL — ABNORMAL HIGH (ref 70–99)
Potassium: 2.5 mmol/L — CL (ref 3.5–5.1)
Sodium: 141 mmol/L (ref 135–145)
Total Bilirubin: 1.5 mg/dL — ABNORMAL HIGH (ref 0.3–1.2)
Total Protein: 3.5 g/dL — ABNORMAL LOW (ref 6.5–8.1)

## 2021-08-12 LAB — MAGNESIUM: Magnesium: 0.6 mg/dL — CL (ref 1.7–2.4)

## 2021-08-12 MED ORDER — SODIUM CHLORIDE 0.9% FLUSH
10.0000 mL | Freq: Once | INTRAVENOUS | Status: AC
  Administered 2021-08-12: 10 mL via INTRAVENOUS

## 2021-08-12 MED ORDER — HEPARIN SOD (PORK) LOCK FLUSH 100 UNIT/ML IV SOLN
500.0000 [IU] | Freq: Once | INTRAVENOUS | Status: AC
  Administered 2021-08-12: 500 [IU] via INTRAVENOUS

## 2021-08-12 MED ORDER — POTASSIUM CHLORIDE 10 MEQ/100ML IV SOLN
10.0000 meq | INTRAVENOUS | Status: AC
  Administered 2021-08-12 (×4): 10 meq via INTRAVENOUS
  Filled 2021-08-12: qty 100

## 2021-08-12 MED ORDER — SODIUM CHLORIDE 0.9% FLUSH
10.0000 mL | INTRAVENOUS | Status: DC | PRN
  Administered 2021-08-12: 10 mL via INTRAVENOUS

## 2021-08-12 MED ORDER — SODIUM CHLORIDE 0.9 % IV SOLN: INTRAVENOUS | Status: DC

## 2021-08-12 MED ORDER — MAGNESIUM SULFATE 4 GM/100ML IV SOLN
4.0000 g | Freq: Once | INTRAVENOUS | Status: AC
  Administered 2021-08-12: 4 g via INTRAVENOUS
  Filled 2021-08-12: qty 100

## 2021-08-12 MED ORDER — SODIUM CHLORIDE 0.9 % IV SOLN: INTRAVENOUS | Status: AC

## 2021-08-12 NOTE — Progress Notes (Signed)
CRITICAL VALUE STICKER  CRITICAL VALUE: K+ 2.5                                Ca+ 5.1                                Mg* 0.6  RECEIVER (on-site recipient of call):Lisa Marcello Moores, NP  DATE & TIME NOTIFIED: 08/12/21 @ 1120  MESSENGER (representative from lab):Gwyn  MD NOTIFIED: Dr. Benay Spice  TIME OF NOTIFICATION: 1124  RESPONSE: Being seen by provider

## 2021-08-12 NOTE — Progress Notes (Signed)
[place] OFFICE PROGRESS NOTE   Diagnosis: Colon cancer  INTERVAL HISTORY:   Mr. Campos was discharged in the hospital 08/07/2021 after admission with back pain.  The pain was of unclear etiology.  He had a negative MRI of the thoracic and lumbar spine.  He continues to have pain, chiefly in the right lateral and posterior chest.  He also has lower back pain intermittently.  He has pain when lying on the right side.  He takes oxycodone as needed.  He had constipation in the hospital and at discharge.  This improved with laxatives and he had diarrhea for several days.  He reports no bowel movement for the past few days.  He is eating very little.  He is drinking water.  He has generalized weakness and exertional dyspnea.  Objective:  Vital signs in last 24 hours:  Blood pressure (!) 129/108, pulse (!) 139, temperature 98 F (36.7 C), temperature source Oral, resp. rate 18, height 5' 10" (1.778 m), weight 247 lb (112 kg), SpO2 98 %.    HEENT: No thrush or ulcers Resp: Decreased breath sounds with inspiratory rhonchi at the right lower posterior chest, no respiratory distress Cardio: Tachycardia, regular rhythm GI: No hepatosplenomegaly, mildly distended Vascular: No leg edema  Skin: Acne type rash over the trunk  Portacath/PICC-without erythema  Lab Results:  Lab Results  Component Value Date   WBC 7.3 08/06/2021   HGB 13.0 08/06/2021   HCT 40.7 08/06/2021   MCV 86.8 08/06/2021   PLT 129 (L) 08/06/2021   NEUTROABS 4.3 08/06/2021    CMP  Lab Results  Component Value Date   NA 141 08/12/2021   K 2.5 (LL) 08/12/2021   CL 117 (H) 08/12/2021   CO2 15 (L) 08/12/2021   GLUCOSE 155 (H) 08/12/2021   BUN 12 08/12/2021   CREATININE 0.57 (L) 08/12/2021   CALCIUM 5.1 (LL) 08/12/2021   PROT 3.5 (L) 08/12/2021   ALBUMIN 1.9 (L) 08/12/2021   AST 16 08/12/2021   ALT 11 08/12/2021   ALKPHOS 148 (H) 08/12/2021   BILITOT 1.5 (H) 08/12/2021   GFRNONAA >60  08/12/2021    Lab Results  Component Value Date   CEA1 7.53 (H) 07/22/2021    Lab Results  Component Value Date   INR 1.0 07/22/2021   LABPROT 13.2 07/22/2021    Imaging:  No results found.  Medications: I have reviewed the patient's current medications.   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 and CDX2 positive FOLFOX plus bevacizumab, cycle 1 08/24/2019, 11 total cycles, oxaliplatin held with cycle 11 secondary to a reaction with cycle 10 FOLFOX plus bevacizumab discontinued after cycle 19 secondary to disease progression FOLFIRI plus bevacizumab beginning 06/20/2020, status post 12 cycles Disease progression on CT 02/11/2021-enlarging lung nodules and liver lesions CEA increased 02/13/2021 CTs 03/11/2021-bulky hypodense liver metastases, slightly progressive, enlargement of bilateral lung nodules, slight enlargement of mediastinal nodes, splenomegaly and gastroesophageal varices Sotorasib/panitumumab on the [place] study beginning 04/01/2021 CTs 05/21/2021-decrease size of lung and liver metastases, no new lesions CTs 07/16/2021-new and enlarged lung lesions, increased size of liver lesions, no new lesions, no new site of metastatic disease Sotorasib/panitumumab continued to 07/23/2021  Type 2 diabetes Left lower extremity DVT 2018 Oxaliplatin reaction with cycle 10 Skin rash most likely related to Panitumumab, grade 2 04/15/2021 Thrombocytopenia Hypomagnesemia secondary to panitumumab Admission 08/05/2021 with new onset severe back pain MRI  thoracic and lumbar spine 08/06/2021-negative for metastatic disease     Disposition: Dan Bell has metastatic colon cancer.  He developed clinical and radiologic evidence of disease progression during a recent hospital admission.  I suspect his pain is related to the hepatic tumor burden.  He has hypokalemia and hypomagnesemia today, likely related to diarrhea and panitumumab therapy.  He will receive intravenous hydration and electrolyte supplements today.  He will return for a lab visit and additional fluids/electrolytes later this week.  We will repeat his vital signs prior to leaving the Cancer center today.  I have a low clinical suspicion for pulmonary embolism, but we will consider a repeat chest CT for persistent tachycardia.  We discussed systemic treatment options.  We discussed the specifics of the Lonsurf/bevacizumab regimen.  I referred him to Dr. Reynaldo Minium to get his opinion and to see whether he may qualify for clinical trial at [facility].  Mr. Lease will be scheduled for an office visit on 08/16/2021.  His ECOG performance status is measured at 1-2 today.  Betsy Coder, MD  08/12/2021  11:31 AM

## 2021-08-12 NOTE — Patient Instructions (Signed)

## 2021-08-12 NOTE — Progress Notes (Signed)
Confirmed referral to Dr. Verlan Friends was obtained and patient was called while in office today. Has appointment on 08/26/21. Dr. Benay Spice made aware and he will reach out to Dr. Reynaldo Minium to request sooner appointment if possible.

## 2021-08-12 NOTE — Patient Instructions (Signed)
Rehydration, Adult °Rehydration is the replacement of body fluids, salts, and minerals (electrolytes) that are lost during dehydration. Dehydration is when there is not enough water or other fluids in the body. This happens when you lose more fluids than you take in. Common causes of dehydration include: °Not drinking enough fluids. This can occur when you are ill or doing activities that require a lot of energy, especially in hot weather. °Conditions that cause loss of water or other fluids, such as diarrhea, vomiting, sweating, or urinating a lot. °Other illnesses, such as fever or infection. °Certain medicines, such as those that remove excess fluid from the body (diuretics). °Symptoms of mild or moderate dehydration may include thirst, dry lips and mouth, and dizziness. Symptoms of severe dehydration may include increased heart rate, confusion, fainting, and not urinating. °For severe dehydration, you may need to get fluids through an IV at the hospital. For mild or moderate dehydration, you can usually rehydrate at home by drinking certain fluids as told by your health care provider. °What are the risks? °Generally, rehydration is safe. However, taking in too much fluid (overhydration) can be a problem. This is rare. Overhydration can cause an electrolyte imbalance, kidney failure, or a decrease in salt (sodium) levels in the body. °Supplies needed °You will need an oral rehydration solution (ORS) if your health care provider tells you to use one. This is a drink to treat dehydration. It can be found in pharmacies and retail stores. °How to rehydrate °Fluids °Follow instructions from your health care provider for rehydration. The kind of fluid and the amount you should drink depend on your condition. In general, you should choose drinks that you prefer. °If told by your health care provider, drink an ORS. °Make an ORS by following instructions on the package. °Start by drinking small amounts, about ½ cup (120  mL) every 5-10 minutes. °Slowly increase how much you drink until you have taken the amount recommended by your health care provider. °Drink enough clear fluids to keep your urine pale yellow. If you were told to drink an ORS, finish it first, then start slowly drinking other clear fluids. Drink fluids such as: °Water. This includes sparkling water and flavored water. Drinking only water can lead to having too little sodium in your body (hyponatremia). Follow the advice of your health care provider. °Water from ice chips you suck on. °Fruit juice with water you add to it (diluted). °Sports drinks. °Hot or cold herbal teas. °Broth-based soups. °Milk or milk products. °Food °Follow instructions from your health care provider about what to eat while you rehydrate. Your health care provider may recommend that you slowly begin eating regular foods in small amounts. °Eat foods that contain a healthy balance of electrolytes, such as bananas, oranges, potatoes, tomatoes, and spinach. °Avoid foods that are greasy or contain a lot of sugar. °In some cases, you may get nutrition through a feeding tube that is passed through your nose and into your stomach (nasogastric tube, or NG tube). This may be done if you have uncontrolled vomiting or diarrhea. °Beverages to avoid °Certain beverages may make dehydration worse. While you rehydrate, avoid drinking alcohol. °How to tell if you are recovering from dehydration °You may be recovering from dehydration if: °You are urinating more often than before you started rehydrating. °Your urine is pale yellow. °Your energy level improves. °You vomit less frequently. °You have diarrhea less frequently. °Your appetite improves or returns to normal. °You feel less dizzy or less light-headed. °Your   skin tone and color start to look more normal. Follow these instructions at home: Take over-the-counter and prescription medicines only as told by your health care provider. Do not take sodium  tablets. Doing this can lead to having too much sodium in your body (hypernatremia). Contact a health care provider if: You continue to have symptoms of mild or moderate dehydration, such as: Thirst. Dry lips. Slightly dry mouth. Dizziness. Dark urine or less urine than normal. Muscle cramps. You continue to vomit or have diarrhea. Get help right away if you: Have symptoms of dehydration that get worse. Have a fever. Have a severe headache. Have been vomiting and the following happens: Your vomiting gets worse or does not go away. Your vomit includes blood or green matter (bile). You cannot eat or drink without vomiting. Have problems with urination or bowel movements, such as: Diarrhea that gets worse or does not go away. Blood in your stool (feces). This may cause stool to look black and tarry. Not urinating, or urinating only a small amount of very dark urine, within 6-8 hours. Have trouble breathing. Have symptoms that get worse with treatment. These symptoms may represent a serious problem that is an emergency. Do not wait to see if the symptoms will go away. Get medical help right away. Call your local emergency services (911 in the [place] Do not drive yourself to the hospital. Summary Rehydration is the replacement of body fluids and minerals (electrolytes) that are lost during dehydration. Follow instructions from your health care provider for rehydration. The kind of fluid and amount you should drink depend on your condition. Slowly increase how much you drink until you have taken the amount recommended by your health care provider. Contact your health care provider if you continue to show signs of mild or moderate dehydration. This information is not intended to replace advice given to you by your health care provider. Make sure you discuss any questions you have with your health care provider. Document Revised: 08/03/2019 Document Reviewed: 06/13/2019 Elsevier Patient  Education  2022 [place].  Hypokalemia Hypokalemia means that the amount of potassium in the blood is lower than normal. Potassium is a chemical (electrolyte) that helps regulate the amount of fluid in the body. It also stimulates muscle tightening (contraction) and helps nerves work properly. Normally, most of the body's potassium is inside cells, and only a very small amount is in the blood. Because the amount in the blood is so small, minor changes to potassium levels in the blood can be life-threatening. What are the causes? This condition may be caused by: Antibiotic medicine. Diarrhea or vomiting. Taking too much of a medicine that helps you have a bowel movement (laxative) can cause diarrhea and lead to hypokalemia. Chronic kidney disease (CKD). Medicines that help the body get rid of excess fluid (diuretics). Eating disorders, such as bulimia. Low magnesium levels in the body. Sweating a lot. What are the signs or symptoms? Symptoms of this condition include: Weakness. Constipation. Fatigue. Muscle cramps. Mental confusion. Skipped heartbeats or irregular heartbeat (palpitations). Tingling or numbness. How is this diagnosed? This condition is diagnosed with a blood test. How is this treated? This condition may be treated by: Taking potassium supplements by mouth. Adjusting the medicines that you take. Eating more foods that contain a lot of potassium. If your potassium level is very low, you may need to get potassium through an IV and be monitored in the hospital. Follow these instructions at home:  Take over-the-counter and prescription  medicines only as told by your health care provider. This includes vitamins and supplements. Eat a healthy diet. A healthy diet includes fresh fruits and vegetables, whole grains, healthy fats, and lean proteins. If instructed, eat more foods that contain a lot of potassium. This includes: Nuts, such as peanuts and pistachios. Seeds,  such as sunflower seeds and pumpkin seeds. Peas, lentils, and lima beans. Whole grain and bran cereals and breads. Fresh fruits and vegetables, such as apricots, avocado, bananas, cantaloupe, kiwi, oranges, tomatoes, asparagus, and potatoes. Orange juice. Tomato juice. Red meats. Yogurt. Keep all follow-up visits as told by your health care provider. This is important. Contact a health care provider if you: Have weakness that gets worse. Feel your heart pounding or racing. Vomit. Have diarrhea. Have diabetes (diabetes mellitus) and you have trouble keeping your blood sugar (glucose) in your target range. Get help right away if you: Have chest pain. Have shortness of breath. Have vomiting or diarrhea that lasts for more than 2 days. Faint. Summary Hypokalemia means that the amount of potassium in the blood is lower than normal. This condition is diagnosed with a blood test. Hypokalemia may be treated by taking potassium supplements, adjusting the medicines that you take, or eating more foods that are high in potassium. If your potassium level is very low, you may need to get potassium through an IV and be monitored in the hospital. This information is not intended to replace advice given to you by your health care provider. Make sure you discuss any questions you have with your health care provider. Document Revised: 01/12/2018 Document Reviewed: 01/13/2018 Elsevier Patient Education  2022 [place].  Hypomagnesemia Hypomagnesemia is a condition in which the level of magnesium in the blood is too low. Magnesium is a mineral that is found in many foods. It is used in many different processes in the body. Hypomagnesemia can affect every organ in the body. In severe cases, it can cause life-threatening problems. What are the causes? This condition may be caused by: Not getting enough magnesium in your diet or not having enough healthy foods to eat (malnutrition). Problems with  magnesium absorption in the intestines. Dehydration. Excessive use of alcohol. Vomiting. Severe or long-term (chronic) diarrhea. Some medicines, including medicines that make you urinate more often (diuretics). Certain diseases, such as kidney disease, diabetes, celiac disease, and overactive thyroid. What are the signs or symptoms? Symptoms of this condition include: Loss of appetite, nausea, and vomiting. Involuntary shaking or trembling of a body part (tremor). Muscle weakness or tingling in the arms and legs. Sudden tightening of muscles (muscle spasms). Confusion. Psychiatric issues, such as: Depression and irritability. Psychosis. A feeling of fluttering of the heart (palpitations). Seizures. These symptoms are more severe if magnesium levels drop suddenly. How is this diagnosed? This condition may be diagnosed based on: Your symptoms and medical history. A physical exam. Blood and urine tests. How is this treated? Treatment depends on the cause and the severity of the condition. It may be treated by: Taking a magnesium supplement. This can be taken in pill form. If the condition is severe, magnesium is usually given through an IV. Making changes to your diet. You may be directed to eat foods that have a lot of magnesium, such as green leafy vegetables, peas, beans, and nuts. Not drinking alcohol. If you are struggling not to drink, ask your health care provider for help. Follow these instructions at home: Eating and drinking   Make sure that your diet includes  foods with magnesium. Foods that have a lot of magnesium in them include: Green leafy vegetables, such as spinach and broccoli. Beans and peas. Nuts and seeds, such as almonds and sunflower seeds. Whole grains, such as whole grain bread and fortified cereals. Drink fluids that contain salts and minerals (electrolytes), such as sports drinks, when you are active. Do not drink alcohol. General instructions Take  over-the-counter and prescription medicines only as told by your health care provider. Take magnesium supplements as directed if your health care provider tells you to take them. Have your magnesium levels monitored as told by your health care provider. Keep all follow-up visits. This is important. Contact a health care provider if: You get worse instead of better. Your symptoms return. Get help right away if: You develop severe muscle weakness. You have trouble breathing. You feel that your heart is racing. These symptoms may represent a serious problem that is an emergency. Do not wait to see if the symptoms will go away. Get medical help right away. Call your local emergency services (911 in the [place] Do not drive yourself to the hospital. Summary Hypomagnesemia is a condition in which the level of magnesium in the blood is too low. Hypomagnesemia can affect every organ in the body. Treatment may include eating more foods that contain magnesium, taking magnesium supplements, and not drinking alcohol. Have your magnesium levels monitored as told by your health care provider. This information is not intended to replace advice given to you by your health care provider. Make sure you discuss any questions you have with your health care provider. Document Revised: 10/30/2020 Document Reviewed: 10/30/2020 Elsevier Patient Education  [place].

## 2021-08-12 NOTE — Progress Notes (Signed)
K+, Mg+ and IVF orders placed.

## 2021-08-12 NOTE — Progress Notes (Signed)
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  In-clinic visit: Not study scheduled visit. Patient in clinic today, with spouse, for hospital discharge follow-up with Dr. Benay Spice.   I met with patient and spouse, during patient's scheduled appointment in exam room. Patient expressed continued back pain (see MD's clinic note) in which he confirms to be taking the prescribed oxycodone 13m every 6 hours as needed, for the pain. Patient reports pain is worse when walking/standing. Patient also states that he continues to be very short of breath, when walking, and cannot walk more than 2 feet without becoming short of breath. Per patient, he first noticed the back pain was present when he woke up at approximately 10:00 AM, Sunday morning of February the 19th, and then the pain progressively worsened through to the afternoon, when he decided to go the emergency room. Patient also reports to have had constipation related to the pain medicine, oxycodone. Patient states that he took several doses of miralax in which he had a few episodes of diarrhea that he states "cleaned me out." Patient confirms presently with no diarrhea. Patient reports to be drinking "lots of water" and confirms that his urine output doesn't equal his fluid intake. Patient with several lab results that are out of range today. MD reviewed and per MD, patient to receive hydration, magnesium and potassium.  Patient was discontinued with study drug treatment of Sotorasib and panitumumab on the 20th of February due to confirmed progression and per study protocol and study Medical Monitor. Patient returned his Cycle 5 day 1 dispensed study drug sotorasib. Patient returned two bottles, these were  returned to [facility] for record of return. One bottle hadn't been opened and the second bottle had 16 tablets remaining (RX 2711-0 , Lot # [id number] expiration of 01/13/2022). The return tablet count confirmed by [facility]. Patient also returned the study provided ediary.Patient reports last dose of sotorasib 960 mg taken on the 19th of February. I discussed with patient study safety follow-up and that we would need to have him return to clinic to assess for any study related adverse events, between March 15 to March 28th, patient gave his verbal understanding. I also explained to patient long term follow-up, should he wish to continue with study. Patient informed of the long-term follow-up required assessments and frequency. Patient confirms that he does want to remain in study for follow up for the time being. All patient's and spouses questions answered to their satisfaction. Patient thanked for his contribution to study and his time and was encouraged to call Dr. SBenay Spiceor myself with any questions/concerns they may have.    MMaxwell Marion RN, BSN, C[facility] Nurse Lead 08/12/2021 2:13 PM

## 2021-08-13 ENCOUNTER — Emergency Department (HOSPITAL_COMMUNITY): Payer: Managed Care, Other (non HMO)

## 2021-08-13 ENCOUNTER — Telehealth: Payer: Self-pay | Admitting: *Deleted

## 2021-08-13 ENCOUNTER — Encounter (HOSPITAL_COMMUNITY): Payer: Self-pay | Admitting: *Deleted

## 2021-08-13 ENCOUNTER — Other Ambulatory Visit: Payer: Self-pay

## 2021-08-13 ENCOUNTER — Inpatient Hospital Stay (HOSPITAL_COMMUNITY)
Admission: EM | Admit: 2021-08-13 | Discharge: 2021-08-15 | DRG: 180 | Disposition: A | Discharge status: Home / Self Care | Payer: Managed Care, Other (non HMO) | Attending: Family Medicine | Admitting: Family Medicine

## 2021-08-13 ENCOUNTER — Observation Stay (HOSPITAL_COMMUNITY): Payer: Managed Care, Other (non HMO)

## 2021-08-13 DIAGNOSIS — Z6836 Body mass index (BMI) 36.0-36.9, adult: Secondary | ICD-10-CM

## 2021-08-13 DIAGNOSIS — R109 Unspecified abdominal pain: Secondary | ICD-10-CM

## 2021-08-13 DIAGNOSIS — C7801 Secondary malignant neoplasm of right lung: Principal | ICD-10-CM | POA: Diagnosis present

## 2021-08-13 DIAGNOSIS — J90 Pleural effusion, not elsewhere classified: Secondary | ICD-10-CM | POA: Diagnosis not present

## 2021-08-13 DIAGNOSIS — I82220 Acute embolism and thrombosis of inferior vena cava: Secondary | ICD-10-CM | POA: Diagnosis present

## 2021-08-13 DIAGNOSIS — Z794 Long term (current) use of insulin: Secondary | ICD-10-CM

## 2021-08-13 DIAGNOSIS — I10 Essential (primary) hypertension: Secondary | ICD-10-CM | POA: Diagnosis present

## 2021-08-13 DIAGNOSIS — J910 Malignant pleural effusion: Secondary | ICD-10-CM | POA: Diagnosis present

## 2021-08-13 DIAGNOSIS — R188 Other ascites: Secondary | ICD-10-CM

## 2021-08-13 DIAGNOSIS — R0600 Dyspnea, unspecified: Secondary | ICD-10-CM | POA: Diagnosis not present

## 2021-08-13 DIAGNOSIS — R7989 Other specified abnormal findings of blood chemistry: Secondary | ICD-10-CM | POA: Diagnosis not present

## 2021-08-13 DIAGNOSIS — Z888 Allergy status to other drugs, medicaments and biological substances status: Secondary | ICD-10-CM

## 2021-08-13 DIAGNOSIS — Z7984 Long term (current) use of oral hypoglycemic drugs: Secondary | ICD-10-CM

## 2021-08-13 DIAGNOSIS — C7802 Secondary malignant neoplasm of left lung: Secondary | ICD-10-CM | POA: Diagnosis present

## 2021-08-13 DIAGNOSIS — C187 Malignant neoplasm of sigmoid colon: Secondary | ICD-10-CM | POA: Diagnosis present

## 2021-08-13 DIAGNOSIS — Z79899 Other long term (current) drug therapy: Secondary | ICD-10-CM

## 2021-08-13 DIAGNOSIS — Z20822 Contact with and (suspected) exposure to covid-19: Secondary | ICD-10-CM | POA: Diagnosis present

## 2021-08-13 DIAGNOSIS — E669 Obesity, unspecified: Secondary | ICD-10-CM | POA: Diagnosis present

## 2021-08-13 DIAGNOSIS — C787 Secondary malignant neoplasm of liver and intrahepatic bile duct: Secondary | ICD-10-CM | POA: Diagnosis present

## 2021-08-13 DIAGNOSIS — C7800 Secondary malignant neoplasm of unspecified lung: Secondary | ICD-10-CM | POA: Diagnosis present

## 2021-08-13 DIAGNOSIS — Z86718 Personal history of other venous thrombosis and embolism: Secondary | ICD-10-CM

## 2021-08-13 DIAGNOSIS — E119 Type 2 diabetes mellitus without complications: Secondary | ICD-10-CM | POA: Diagnosis present

## 2021-08-13 DIAGNOSIS — R0602 Shortness of breath: Secondary | ICD-10-CM

## 2021-08-13 LAB — BLOOD GAS, VENOUS
Acid-base deficit: 0.8 mmol/L (ref 0.0–2.0)
Bicarbonate: 24.3 mmol/L (ref 20.0–28.0)
O2 Saturation: 50.1 %
Patient temperature: 37
pCO2, Ven: 41 mmHg — ABNORMAL LOW (ref 44–60)
pH, Ven: 7.38 (ref 7.25–7.43)
pO2, Ven: 35 mmHg (ref 32–45)

## 2021-08-13 LAB — CBC WITH DIFFERENTIAL/PLATELET
Abs Immature Granulocytes: 0.11 10*3/uL — ABNORMAL HIGH (ref 0.00–0.07)
Basophils Absolute: 0.1 10*3/uL (ref 0.0–0.1)
Basophils Relative: 1 %
Eosinophils Absolute: 0.3 10*3/uL (ref 0.0–0.5)
Eosinophils Relative: 3 %
HCT: 42.7 % (ref 39.0–52.0)
Hemoglobin: 13.9 g/dL (ref 13.0–17.0)
Immature Granulocytes: 1 %
Lymphocytes Relative: 19 %
Lymphs Abs: 2.3 10*3/uL (ref 0.7–4.0)
MCH: 27.5 pg (ref 26.0–34.0)
MCHC: 32.6 g/dL (ref 30.0–36.0)
MCV: 84.6 fL (ref 80.0–100.0)
Monocytes Absolute: 1.4 10*3/uL — ABNORMAL HIGH (ref 0.1–1.0)
Monocytes Relative: 11 %
Neutro Abs: 8.4 10*3/uL — ABNORMAL HIGH (ref 1.7–7.7)
Neutrophils Relative %: 65 %
Platelets: 237 10*3/uL (ref 150–400)
RBC: 5.05 MIL/uL (ref 4.22–5.81)
RDW: 14.3 % (ref 11.5–15.5)
WBC: 12.7 10*3/uL — ABNORMAL HIGH (ref 4.0–10.5)
nRBC: 0 % (ref 0.0–0.2)

## 2021-08-13 LAB — COMPREHENSIVE METABOLIC PANEL
ALT: 22 U/L (ref 0–44)
AST: 28 U/L (ref 15–41)
Albumin: 3 g/dL — ABNORMAL LOW (ref 3.5–5.0)
Alkaline Phosphatase: 322 U/L — ABNORMAL HIGH (ref 38–126)
Anion gap: 8 (ref 5–15)
BUN: 19 mg/dL (ref 6–20)
CO2: 24 mmol/L (ref 22–32)
Calcium: 8.9 mg/dL (ref 8.9–10.3)
Chloride: 100 mmol/L (ref 98–111)
Creatinine, Ser: 1.25 mg/dL — ABNORMAL HIGH (ref 0.61–1.24)
GFR, Estimated: >60 mL/min (ref >60)
Glucose, Bld: 160 mg/dL — ABNORMAL HIGH (ref 70–99)
Potassium: 4.7 mmol/L (ref 3.5–5.1)
Sodium: 132 mmol/L — ABNORMAL LOW (ref 135–145)
Total Bilirubin: 2.7 mg/dL — ABNORMAL HIGH (ref 0.3–1.2)
Total Protein: 6.8 g/dL (ref 6.5–8.1)

## 2021-08-13 LAB — RESP PANEL BY RT-PCR (FLU A&B, COVID) ARPGX2
Influenza A by PCR: NEGATIVE
Influenza B by PCR: NEGATIVE
SARS Coronavirus 2 by RT PCR: NEGATIVE

## 2021-08-13 LAB — GLUCOSE, CAPILLARY
Glucose-Capillary: 178 mg/dL — ABNORMAL HIGH (ref 70–99)
Glucose-Capillary: 218 mg/dL — ABNORMAL HIGH (ref 70–99)
Glucose-Capillary: 198 mg/dL — ABNORMAL HIGH (ref 70–99)

## 2021-08-13 LAB — LACTIC ACID, PLASMA: Lactic Acid, Venous: 1.6 mmol/L (ref 0.5–1.9)

## 2021-08-13 LAB — BRAIN NATRIURETIC PEPTIDE: B Natriuretic Peptide: 37.9 pg/mL (ref 0.0–100.0)

## 2021-08-13 MED ORDER — SODIUM CHLORIDE 0.9% FLUSH: 10.0000 mL | Freq: Two times a day (BID) | INTRAVENOUS | Status: DC

## 2021-08-13 MED ORDER — SODIUM CHLORIDE 0.9% FLUSH: 10.0000 mL | INTRAVENOUS | Status: DC | PRN

## 2021-08-13 MED ORDER — SODIUM CHLORIDE (PF) 0.9 % IJ SOLN
INTRAMUSCULAR | Status: AC
  Administered 2021-08-13: 5 mL
  Filled 2021-08-13: qty 50

## 2021-08-13 MED ORDER — LACTATED RINGERS IV SOLN: INTRAVENOUS | Status: DC

## 2021-08-13 MED ORDER — HYDROMORPHONE HCL 1 MG/ML IJ SOLN
1.0000 mg | INTRAMUSCULAR | Status: DC | PRN
  Administered 2021-08-13 – 2021-08-15 (×6): 1 mg via INTRAVENOUS
  Filled 2021-08-13 (×6): qty 1

## 2021-08-13 MED ORDER — PROCHLORPERAZINE EDISYLATE 10 MG/2ML IJ SOLN
10.0000 mg | Freq: Four times a day (QID) | INTRAMUSCULAR | Status: DC | PRN
  Filled 2021-08-13: qty 2

## 2021-08-13 MED ORDER — IOHEXOL 300 MG/ML  SOLN
100.0000 mL | Freq: Once | INTRAMUSCULAR | Status: AC | PRN
  Administered 2021-08-13: 100 mL via INTRAVENOUS

## 2021-08-13 MED ORDER — FUROSEMIDE 10 MG/ML IJ SOLN
20.0000 mg | Freq: Once | INTRAMUSCULAR | Status: AC
  Administered 2021-08-13: 20 mg via INTRAVENOUS
  Filled 2021-08-13: qty 4

## 2021-08-13 MED ORDER — LACTATED RINGERS IV SOLN: INTRAVENOUS | Status: AC

## 2021-08-13 MED ORDER — INSULIN ASPART 100 UNIT/ML IJ SOLN
0.0000 [IU] | Freq: Three times a day (TID) | INTRAMUSCULAR | Status: DC
  Administered 2021-08-13: 3 [IU] via SUBCUTANEOUS
  Administered 2021-08-13: 5 [IU] via SUBCUTANEOUS
  Administered 2021-08-14 (×2): 3 [IU] via SUBCUTANEOUS
  Administered 2021-08-14 – 2021-08-15 (×2): 2 [IU] via SUBCUTANEOUS
  Administered 2021-08-15: 5 [IU] via SUBCUTANEOUS
  Filled 2021-08-13: qty 0.15

## 2021-08-13 MED ORDER — ONDANSETRON HCL 4 MG PO TABS: 4.0000 mg | ORAL_TABLET | Freq: Four times a day (QID) | ORAL | Status: DC | PRN

## 2021-08-13 MED ORDER — OXYCODONE HCL 5 MG PO TABS
5.0000 mg | ORAL_TABLET | ORAL | Status: DC | PRN
  Administered 2021-08-13 – 2021-08-15 (×5): 5 mg via ORAL
  Filled 2021-08-13 (×6): qty 1

## 2021-08-13 MED ORDER — POLYETHYLENE GLYCOL 3350 17 G PO PACK: 17.0000 g | PACK | Freq: Every day | ORAL | Status: DC | PRN

## 2021-08-13 MED ORDER — HYDROMORPHONE HCL 1 MG/ML IJ SOLN
0.5000 mg | Freq: Once | INTRAMUSCULAR | Status: AC
  Administered 2021-08-13: 0.5 mg via INTRAVENOUS
  Filled 2021-08-13: qty 1

## 2021-08-13 MED ORDER — INSULIN ASPART 100 UNIT/ML IJ SOLN: 0.0000 [IU] | Freq: Three times a day (TID) | INTRAMUSCULAR | Status: DC

## 2021-08-13 MED ORDER — FUROSEMIDE 10 MG/ML IJ SOLN
20.0000 mg | Freq: Once | INTRAMUSCULAR | Status: AC
  Administered 2021-08-13: 20 mg via INTRAVENOUS
  Filled 2021-08-13: qty 2

## 2021-08-13 MED ORDER — IOHEXOL 350 MG/ML SOLN
75.0000 mL | Freq: Once | INTRAVENOUS | Status: AC | PRN
  Administered 2021-08-13: 70 mL via INTRAVENOUS

## 2021-08-13 MED ORDER — ONDANSETRON HCL 4 MG/2ML IJ SOLN
4.0000 mg | Freq: Four times a day (QID) | INTRAMUSCULAR | Status: DC | PRN
  Administered 2021-08-13: 4 mg via INTRAVENOUS
  Filled 2021-08-13: qty 2

## 2021-08-13 MED ORDER — CHLORHEXIDINE GLUCONATE CLOTH 2 % EX PADS
6.0000 | MEDICATED_PAD | Freq: Every day | CUTANEOUS | Status: DC
  Administered 2021-08-13 – 2021-08-15 (×3): 6 via TOPICAL

## 2021-08-13 NOTE — ED Triage Notes (Signed)
SOB for several weeks, feels like his legs and abdomen are swollen.   Last oxycodone about 4 hours ago.   Treated for stage 4 metastatic colon cancer.

## 2021-08-13 NOTE — Plan of Care (Signed)

## 2021-08-13 NOTE — Telephone Encounter (Signed)
Oncology Discharge Planning Admission Note  [facility] [place] Address: [address], [place], [place] [address] Hours of Operation:  8am - 5pm, Monday - Friday  Clinic Contact Information:  [phone]  Oncology Care Team: Medical Oncologist:  Dr. Betsy Coder  Contacted  [facility]  to inform that the oncology provider Dr. Benay Spice  is aware of this hospital admission dated 08/13/21, and the cancer center will follow [facility] inpatient care to assist with discharge planning as indicated by the oncologist. MD may be rounding on him in the hospital as well. We will reach out to you closer to discharge date to arrange your follow up care. Dr. Benay Spice and Dr. Reynaldo Minium at [facility] discussed case and they may be able to get him seen there on 08/16/21. [facility] will be calling her. Disclaimer:  This [name] note does not imply a formal consult request has been made by the admitting attending for this admission or there will be an inpatient consult completed by oncology.  Please request oncology consults as per standard process as indicated.

## 2021-08-13 NOTE — ED Provider Notes (Signed)
[facility] DEPT Provider Note  CSN: [id number] Arrival date & time: 08/13/21 0102  Chief Complaint(s) No chief complaint on file.  HPI Dan Bell is a 51 y.o. 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  He was seen 1 to 1-1/2 weeks ago for similar episode and had a CT scan revealing the metastatic disease in the lungs without evidence of pulmonary embolism.  Shortness of breath is the same but has continued to worsen in that timeframe.  Exacerbated with exertion even with short distances.  Patient is followed by oncology who saw the patient earlier in the day and gave him IV fluids.  I reviewed the labs that were taken at that time which showed metabolic panel with electrolyte derangements.  To me it appears that the metabolic panel was diluted.   Patient is complaining of chronic right upper quadrant abdominal pain attributed to his cancer burden. He also has bilateral lower extremity edema and abdominal distention that has worsened tonight.  The history is provided by the patient.   Past Medical History Past Medical History:  Diagnosis Date   Colon cancer [facility])    Patient Active Problem List   Diagnosis Date Noted   Dyspnea 08/13/2021   Intractable pain 08/06/2021   Hypomagnesemia 08/06/2021   Cancer, metastatic to lung ([place]) 08/05/2021   DM2 (diabetes mellitus, type 2) ([place]) 08/05/2021   HTN (hypertension) 08/05/2021   Back pain 08/05/2021   Cancer of sigmoid colon ([place]) 02/21/2021   Home Medication(s) Prior to Admission medications   Medication Sig Start Date End Date Taking? Authorizing Provider  acetaminophen (TYLENOL) 500 MG tablet Take 1,000 mg by mouth every 6 (six) hours as needed for headache (pain).    [provider]  B-D UF III MINI PEN NEEDLES 31G X 5 MM MISC  Inject into the skin. 02/20/21   [provider]  Continuous Blood Gluc Sensor (FREESTYLE LIBRE 14 DAY SENSOR) MISC SMARTSIG:1 Each Topical Every 2 Weeks 03/18/21   [provider]  cyclobenzaprine (FLEXERIL) 5 MG tablet Take 1 tablet (5 mg total) by mouth 3 (three) times daily as needed for muscle spasms. 08/07/21   Antonieta Pert, MD  docusate sodium (COLACE) 100 MG capsule Take 1 capsule (100 mg total) by mouth daily. 08/07/21   Antonieta Pert, MD  insulin lispro (HUMALOG) 100 UNIT/ML KwikPen Inject 5-14 Units into the skin 3 (three) times daily as needed (CBG >200). 11/27/20   [provider]  Insulin Pen Needle (B-D UF III MINI PEN NEEDLES) 31G X 5 MM MISC USE TO GIVE INSULIN THREE TIMES DAILY 07/24/20   [provider]  Insulin Pen Needle (PEN NEEDLES 31GX5/16") 31G X 8 MM MISC Needles for Basaglar Kwik pens 09/07/19   [provider]  metFORMIN (GLUCOPHAGE) 1000 MG tablet Take 1,000 mg by mouth 2 (two) times daily. 01/24/21   [provider]  polyethylene glycol (MIRALAX / GLYCOLAX) 17 g packet Take 17 g by mouth daily. 08/08/21   Antonieta Pert, MD  prochlorperazine (COMPAZINE) 10 MG tablet Take 10 mg by mouth daily as needed for nausea or vomiting. 11/16/19   [provider]  Allergies Empagliflozin, Fosaprepitant, and Oxaliplatin  Review of Systems Review of Systems As noted in HPI  Physical Exam Vital Signs  I have reviewed the triage vital signs BP (!) 131/110    Pulse (!) 119    Temp 98.8 F (37.1 C) (Oral)    Resp 20    SpO2 94%   Physical Exam Vitals reviewed.  Constitutional:      General: He is not in acute distress.    Appearance: He is well-developed. He is not diaphoretic.  HENT:     Head: Normocephalic and atraumatic.     Nose: Nose normal.  Eyes:     General: No scleral icterus.       Right eye:  No discharge.        Left eye: No discharge.     Conjunctiva/sclera: Conjunctivae normal.     Pupils: Pupils are equal, round, and reactive to light.  Cardiovascular:     Rate and Rhythm: Regular rhythm. Tachycardia present.     Heart sounds: No murmur heard.   No friction rub. No gallop.  Pulmonary:     Effort: Pulmonary effort is normal. No respiratory distress.     Breath sounds: Normal breath sounds. No stridor. No rales.  Abdominal:     General: There is distension.     Palpations: Abdomen is soft.     Tenderness: There is abdominal tenderness in the right upper quadrant. There is no right CVA tenderness, left CVA tenderness, guarding or rebound.  Musculoskeletal:        General: No tenderness.     Cervical back: Normal range of motion and neck supple.     Right lower leg: 1+ Pitting Edema present.     Left lower leg: 1+ Pitting Edema present.  Skin:    General: Skin is warm and dry.     Findings: No erythema or rash.  Neurological:     Mental Status: He is alert and oriented to person, place, and time.    ED Results and Treatments Labs (all labs ordered are listed, but only abnormal results are displayed) Labs Reviewed  BLOOD GAS, VENOUS - Abnormal; Notable for the following components:      Result Value   pCO2, Ven 41 (*)    All other components within normal limits  CBC WITH DIFFERENTIAL/PLATELET - Abnormal; Notable for the following components:   WBC 12.7 (*)    Neutro Abs 8.4 (*)    Monocytes Absolute 1.4 (*)    Abs Immature Granulocytes 0.11 (*)    All other components within normal limits  COMPREHENSIVE METABOLIC PANEL - Abnormal; Notable for the following components:   Sodium 132 (*)    Glucose, Bld 160 (*)    Creatinine, Ser 1.25 (*)    Albumin 3.0 (*)    Alkaline Phosphatase 322 (*)    Total Bilirubin 2.7 (*)    All other components within normal limits  RESP PANEL BY RT-PCR (FLU A&B, COVID) ARPGX2  BRAIN NATRIURETIC PEPTIDE  LACTIC ACID, PLASMA  EKG  EKG Interpretation  Date/Time:  Tuesday August 13 2021 01:20:16 EST Ventricular Rate:  117 PR Interval:  131 QRS Duration: 77 QT Interval:  309 QTC Calculation: 431 R Axis:   84 Text Interpretation: Sinus tachycardia Borderline T abnormalities, anterior leads Confirmed by Addison Lank [phone]) on 08/13/2021 8:59:54 AM       Radiology DG Chest 2 View  Addendum Date: 08/13/2021   ADDENDUM REPORT: 08/13/2021 02:00 ADDENDUM: Known pulmonary metastasis.  No acute airspace disease. Electronically Signed   By: Ulyses Jarred M.D.   On: 08/13/2021 02:00   Result Date: 08/13/2021 CLINICAL DATA:  Shortness of breath EXAM: CHEST - 2 VIEW COMPARISON:  08/04/2021 FINDINGS: The heart size and mediastinal contours are within normal limits. There are multiple large pulmonary nodules again seen, most clearly in the right lung. The visualized skeletal structures are unremarkable. There is a right chest wall power-injectable Port-A-Cath with tip in the proximal right atrium via a right internal jugular vein approach. IMPRESSION: Multiple large pulmonary nodules, likely metastatic disease. Electronically Signed: By: Ulyses Jarred M.D. On: 08/13/2021 01:54   CT CHEST ABDOMEN PELVIS W CONTRAST  Result Date: 08/13/2021 CLINICAL DATA:  Left lower quadrant pain. History of colon cancer with liver metastases. * onc * EXAM: CT CHEST, ABDOMEN, AND PELVIS WITH CONTRAST TECHNIQUE: Multidetector CT imaging of the chest, abdomen and pelvis was performed following the standard protocol during bolus administration of intravenous contrast. RADIATION DOSE REDUCTION: This exam was performed according to the departmental dose-optimization program which includes automated exposure control, adjustment of the mA and/or kV according to patient size and/or use of iterative reconstruction technique. CONTRAST:  153mL  OMNIPAQUE IOHEXOL 300 MG/ML  SOLN COMPARISON:  08/04/2021 FINDINGS: CT CHEST FINDINGS Cardiovascular: The heart size is normal. No substantial pericardial effusion. No thoracic aortic aneurysm. No substantial atherosclerosis of the thoracic aorta. Right Port-A-Cath tip is positioned at the SVC/RA junction. Mediastinum/Nodes: 2.2 cm short axis right hilar node on 24/2 is mildly progressive in the interval. No left hilar lymphadenopathy. The esophagus has normal imaging features. There is no axillary lymphadenopathy. Lungs/Pleura: Bilateral pulmonary metastases again noted. Anterior right upper lobe nodule on 51/3 measures 2.1 cm today compared to 1.9 cm previously (remeasured). Index anterior right upper lobe nodule measured previously at 2.0 cm maximum diameter is now 2.3 cm on 58/3. Index right infrahilar nodule measured previously at 3.7 cm maximum dimension is now 4.3 cm on image 82/3. Index medial left upper lobe nodule measured previously at 2.8 cm is 3.0 cm today on 81/3. Interval development of small right pleural effusion with right base collapse/consolidation. Musculoskeletal: No worrisome lytic or sclerotic osseous abnormality. CT ABDOMEN PELVIS FINDINGS Hepatobiliary: Large irregular posterior right hepatic lobe lesion measured previously at 11.7 x 10.1 cm is 11.7 x 11.2 cm when remeasured in a similar fashion today. This lesion is contiguous with the intrahepatic IVC and likely involves the right hepatic vein. There is a large filling defect in the intrahepatic IVC extending up to the level of the IVC/RA junction (see image 38/series 2 and coronal 96/4). Gallbladder is nondistended. No intrahepatic or extrahepatic biliary dilation. Pancreas: No focal mass lesion. No dilatation of the main duct. No intraparenchymal cyst. No peripancreatic edema. Spleen: Mild splenomegaly.  No focal mass lesion. Adrenals/Urinary Tract: No adrenal nodule or mass. Kidneys unremarkable. No evidence for hydroureter. The  urinary bladder appears normal for the degree of distention. Stomach/Bowel: Stomach is unremarkable. No gastric wall thickening. No evidence of outlet obstruction. Duodenum is  normally positioned as is the ligament of Treitz. No small bowel wall thickening. No small bowel dilatation. The terminal ileum is normal. The appendix is not well visualized, but there is no edema or inflammation in the region of the cecum. No gross colonic mass. No colonic wall thickening. Vascular/Lymphatic: No abdominal aortic aneurysm upper normal lymph nodes are seen in the hepatoduodenal ligament. No retroperitoneal lymphadenopathy. No pelvic lymphadenopathy. Portal vein, superior mesenteric vein, and splenic vein are patent. Laminar flow of un opacified blood is seen in the iliac veins and infrarenal IVC. Reproductive: The prostate gland and seminal vesicles are unremarkable. Other: Small volume ascites. Musculoskeletal: No worrisome lytic or sclerotic osseous abnormality. IMPRESSION: 1. Mild interval progression of bilateral pulmonary and right hilar metastases with interval development of small right pleural effusion with right base collapse/consolidation. 2. Mild interval progression of the large irregular posterior right hepatic lobe conglomerate lesion, contiguous with the intrahepatic IVC. Large filling defect in the intrahepatic IVC extending up to the level of the IVC/RA junction. In retrospect this was present on prior study of 08/04/2021 but much more subtle due to the arterial phase timing of that CTA exam. Findings today may reflect extension of tumor into the right hepatic vein and then into the IVC versus bland thrombus. 3. Mild splenomegaly again noted. 4. Small volume ascites. Electronically Signed   By: Misty Stanley M.D.   On: 08/13/2021 07:04    Pertinent labs & imaging results that were available during my care of the patient were reviewed by me and considered in my medical decision making (see MDM for  details).  Medications Ordered in ED Medications  ondansetron (ZOFRAN) tablet 4 mg (has no administration in time range)    Or  ondansetron (ZOFRAN) injection 4 mg (has no administration in time range)  HYDROmorphone (DILAUDID) injection 1 mg (has no administration in time range)  HYDROmorphone (DILAUDID) injection 0.5 mg (0.5 mg Intravenous Given 08/13/21 0222)  sodium chloride (PF) 0.9 % injection (5 mLs  Given by Other 08/13/21 0545)  iohexol (OMNIPAQUE) 300 MG/ML solution 100 mL (100 mLs Intravenous Contrast Given 08/13/21 0545)  HYDROmorphone (DILAUDID) injection 0.5 mg (0.5 mg Intravenous Given 08/13/21 0652)  furosemide (LASIX) injection 20 mg (20 mg Intravenous Given 08/13/21 7564)                                                                                                                                     Procedures Procedures  (including critical care time)  Medical Decision Making / ED Course         Shortness of breath in the setting of known metastatic colon cancer to the lungs and liver. Patient has evidence of anasarca on exam.  I reviewed the CT scan from his previous visit and it appears the patient is developing ascites.  That CT scan was negative for PE.  Since the patient's pain has been persistent and continuously  worsening I have low suspicion for PE at this time and feel that his symptoms are likely due to the metastatic disease and likely fluid buildup. We will get screening labs to confirm whether the labs in clinic were diluted.  We we will also assess for evidence of possible infection.  Will get BnP to assess for evidence of heart failure, though I feel this is less likely.  Work-up ordered to assess concerns above.  Labs and imaging independently interpreted by me and noted below: CBC with mild leukocytosis and no anemia Metabolic panel corrected from previous and patient has no significant electrolyte derangement.  Renal function is at baseline.  Albumin  at baseline at 3.0. BnP within normal limits COVID/influenza negative. Lactic acid negative Chest x-ray without evidence of pneumonia, pneumothorax.  No pulmonary edema.  Patient appears to have small pleural effusion on the right. CT scan of the chest abdomen and pelvis confirmed pleural effusion on the right with developing ascites around the liver down into  Morison's pouch and cul-de-sac and down to the pelvis. Radiology confirm and also noted patient has increased tumor burden in the liver.  Management: Patient provided with IV pain medicine. Given low-dose Lasix. I spoke with hospitalist service who agreed to admit patient for further work-up and management.  They will consult oncology.  Reassessment: Hemodynamically stable.  Final Clinical Impression(s) / ED Diagnoses Final diagnoses:  SOB (shortness of breath)  Abdominal pain  Other ascites  Pleural effusion           This chart was dictated using voice recognition software.  Despite best efforts to proofread,  errors can occur which can change the documentation meaning.    Fatima Blank, MD 08/13/21 [phone]

## 2021-08-13 NOTE — H&P (Signed)
History and Physical    Patient: Dan Bell YQM:[id number] DOB: [birth year] DOA: 08/13/2021 DOS: the patient was seen and examined on 08/13/2021 PCP: Crista Elliot, PA-C  Patient coming from: Home  Chief Complaint:  Chief Complaint  Patient presents with   Shortness of Breath    HPI: DQUAN CORTOPASSI is a 51 y.o. male with medical history significant of colon cancer with metastasis to liver and lung, type 2 diabetes, class I obesity, hypertension who is coming to the emergency department with progressively worse dyspnea for the past 2 weeks associated with about a week of abdominal swelling and lower extremity edema for the past 2 days.  No chest pain, palpitations, diaphoresis, PND, but positive orthopnea.  He denied fever, chills, sore throat, rhinorrhea, wheezing or hemoptysis. He also complains of RUQ pain associated with nausea, but no emesis diarrhea, melena or hematochezia.  He gets frequently constipated.  No flank pain, dysuria, frequency or hematuria.  No polyuria, polydipsia, polyphagia or blurry vision.  ED course: Initial vital signs were temperature 98.8 F, pulse 129, respirations 21, BP 141/104 and O2 sat 98% on room air.  The patient received 20 mg of furosemide and hydromorphone 0.5 mg IVP.  I added 1000 mL of LR over 10 hours and another 20 mg of furosemide after second contrasted study was done.  Lab work: CBC is her white count 12.7, hemoglobin 13.9 g/dL platelets 237.  Venous blood gas was unremarkable aside for PCO2 mildly decreased at 41 mmHg. lactic acid was normal.  BNP 37.9 pg milliliter.  CMP showed a sodium 132 mmol/L, an albumin of 3.0 g/dL, alkaline phosphatase of 322 units/L, a glucose of 160 and total bilirubin 2.7 mg/dL.    Imaging: Two-view chest radiograph showing metastatic disease.  CT chest, abdomen and pelvis with contrast showed progression of pulmonary metastasis and development of a small right pleural effusion with right base  collapse/consolidation.  Questionable IVC bland thrombus.  CTA chest done at the request of oncology did not show PE.  Review of Systems: As mentioned in the history of present illness. All other systems reviewed and are negative. Past Medical History:  Diagnosis Date   Colon cancer [facility])    History reviewed. No pertinent surgical history. Social History:  reports that he has never smoked. He has never been exposed to tobacco smoke. He has never used smokeless tobacco. He reports that he does not drink alcohol and does not use drugs.  Allergies  Allergen Reactions   Empagliflozin Other (See Comments)    GI upset   Fosaprepitant Other (See Comments)    Flushing   Oxaliplatin Other (See Comments)    Facial flush, short of breath    History reviewed. No pertinent family history.  Prior to Admission medications   Medication Sig Start Date End Date Taking? Authorizing Provider  cyclobenzaprine (FLEXERIL) 5 MG tablet Take 1 tablet (5 mg total) by mouth 3 (three) times daily as needed for muscle spasms. 08/07/21  Yes Antonieta Pert, MD  docusate sodium (COLACE) 100 MG capsule Take 1 capsule (100 mg total) by mouth daily. Patient taking differently: Take 100 mg by mouth at bedtime. 08/07/21  Yes Kc, Maren Beach, MD  insulin lispro (HUMALOG) 100 UNIT/ML KwikPen Inject 5-14 Units into the skin 3 (three) times daily as needed (CBG >200). 11/27/20  Yes [provider]  metFORMIN (GLUCOPHAGE) 1000 MG tablet Take 1,000 mg by mouth daily with breakfast. 01/24/21  Yes [provider]  polyethylene glycol (MIRALAX / GLYCOLAX)  17 g packet Take 17 g by mouth daily. 08/08/21  Yes Antonieta Pert, MD  prochlorperazine (COMPAZINE) 10 MG tablet Take 10 mg by mouth daily as needed for nausea or vomiting. 11/16/19  Yes [provider]  B-D UF III MINI PEN NEEDLES 31G X 5 MM MISC Inject into the skin. 02/20/21   [provider]  Continuous Blood Gluc Sensor (FREESTYLE LIBRE 14 DAY SENSOR) MISC  SMARTSIG:1 Each Topical Every 2 Weeks 03/18/21   [provider]  Insulin Pen Needle (B-D UF III MINI PEN NEEDLES) 31G X 5 MM MISC USE TO GIVE INSULIN THREE TIMES DAILY 07/24/20   [provider]  Insulin Pen Needle (PEN NEEDLES 31GX5/16") 31G X 8 MM MISC Needles for Basaglar Kwik pens 09/07/19   [provider]    Physical Exam: Vitals:   08/13/21 1045 08/13/21 1200 08/13/21 1428 08/13/21 1430  BP: (!) 137/100 (!) 139/93  (!) 122/98  Pulse: (!) 109 (!) 114  (!) 113  Resp: (!) 22 (!) 21  20  Temp:  98.4 F (36.9 C)  99 F (37.2 C)  TempSrc:    Oral  SpO2: 93% 96%  97%  Weight:   114.2 kg   Height:   5\' 10"  (1.778 m)    Physical Exam Constitutional:      Appearance: He is obese. He is ill-appearing.  HENT:     Head: Normocephalic.     Mouth/Throat:     Mouth: Mucous membranes are moist.  Eyes:     Pupils: Pupils are equal, round, and reactive to light.  Neck:     Vascular: No JVD.  Cardiovascular:     Rate and Rhythm: Normal rate and regular rhythm.     Heart sounds: S1 normal and S2 normal.  Pulmonary:     Effort: Pulmonary effort is normal.     Breath sounds: Examination of the right-middle field reveals decreased breath sounds. Examination of the right-lower field reveals decreased breath sounds. Decreased breath sounds present. No wheezing or rales.  Abdominal:     General: There is no distension.     Palpations: Abdomen is soft.     Tenderness: There is abdominal tenderness in the right upper quadrant. There is no guarding or rebound.     Comments: Distention due to ascites.  Musculoskeletal:     Cervical back: Neck supple.     Right lower leg: 1+ Pitting Edema present.     Left lower leg: 1+ Pitting Edema present.  Skin:    General: Skin is warm and dry.  Neurological:     General: No focal deficit present.     Mental Status: He is alert and oriented to person, place, and time.  Psychiatric:        Mood and Affect: Mood normal.         Behavior: Behavior normal.   Data Reviewed:  There are no new results to review at this time.  Assessment and Plan: Principal Problem:   Dyspnea Secondary to   Lung metastases and   Pleural effusion on right BNP was normal. Observation/telemetry. Continue supplemental oxygen. Furosemide 20 mg IVP QD or BID as needed. Analgesics as needed. Recheck chest radiograph in AM. Consider thoracentesis if no decrease or worsening.  Active Problems:   Cancer of sigmoid colon ([place]) Dr. Learta Codding will speak to Dr. Reynaldo Minium from [facility] oncology today. Dr. Learta Codding will see in consult tomorrow morning.    DM2 (diabetes mellitus, type 2) (HCC) Last Hemoglobin  A1c was 7.1% 1 week ago. Carbohydrate modified diet. CBG monitoring with RI SS. Hold metformin due to IV contrast use.    HTN (hypertension) Not on antihypertensives at home. Has received furosemide 20 mg IVP twice. Monitor blood pressure, renal function electrolytes.    Abnormal LFTs In the setting of liver mets.   Advance Care Planning:   Code Status: Full Code   Consults: Dr. Benay Spice will evaluate in AM.  Family Communication:   Severity of Illness: The appropriate patient status for this patient is OBSERVATION. Observation status is judged to be reasonable and necessary in order to provide the required intensity of service to ensure the patient's safety. The patient's presenting symptoms, physical exam findings, and initial radiographic and laboratory data in the context of their medical condition is felt to place them at decreased risk for further clinical deterioration. Furthermore, it is anticipated that the patient will be medically stable for discharge from the hospital within 2 midnights of admission.   Author: Reubin Milan, MD 08/13/2021 3:29 PM  For on call review [email or web address].   This document was prepared using Dragon voice recognition software and may contain some unintended transcription errors.

## 2021-08-13 NOTE — ED Notes (Signed)
Resting quietly with eyes closed. No signs of distress. VSS.

## 2021-08-14 ENCOUNTER — Other Ambulatory Visit: Payer: Managed Care, Other (non HMO)

## 2021-08-14 ENCOUNTER — Ambulatory Visit: Payer: Managed Care, Other (non HMO)

## 2021-08-14 ENCOUNTER — Observation Stay (HOSPITAL_COMMUNITY): Payer: Managed Care, Other (non HMO)

## 2021-08-14 DIAGNOSIS — C187 Malignant neoplasm of sigmoid colon: Secondary | ICD-10-CM | POA: Diagnosis not present

## 2021-08-14 DIAGNOSIS — I10 Essential (primary) hypertension: Secondary | ICD-10-CM | POA: Diagnosis not present

## 2021-08-14 DIAGNOSIS — C7802 Secondary malignant neoplasm of left lung: Secondary | ICD-10-CM

## 2021-08-14 DIAGNOSIS — C7801 Secondary malignant neoplasm of right lung: Principal | ICD-10-CM

## 2021-08-14 LAB — BASIC METABOLIC PANEL
Anion gap: 8 (ref 5–15)
BUN: 18 mg/dL (ref 6–20)
CO2: 24 mmol/L (ref 22–32)
Calcium: 8.5 mg/dL — ABNORMAL LOW (ref 8.9–10.3)
Chloride: 97 mmol/L — ABNORMAL LOW (ref 98–111)
Creatinine, Ser: 1.2 mg/dL (ref 0.61–1.24)
GFR, Estimated: >60 mL/min (ref >60)
Glucose, Bld: 132 mg/dL — ABNORMAL HIGH (ref 70–99)
Potassium: 4.1 mmol/L (ref 3.5–5.1)
Sodium: 129 mmol/L — ABNORMAL LOW (ref 135–145)

## 2021-08-14 LAB — GLUCOSE, CAPILLARY
Glucose-Capillary: 146 mg/dL — ABNORMAL HIGH (ref 70–99)
Glucose-Capillary: 186 mg/dL — ABNORMAL HIGH (ref 70–99)
Glucose-Capillary: 174 mg/dL — ABNORMAL HIGH (ref 70–99)
Glucose-Capillary: 200 mg/dL — ABNORMAL HIGH (ref 70–99)

## 2021-08-14 LAB — CBC
HCT: 38.5 % — ABNORMAL LOW (ref 39.0–52.0)
Hemoglobin: 12.6 g/dL — ABNORMAL LOW (ref 13.0–17.0)
MCH: 27.8 pg (ref 26.0–34.0)
MCHC: 32.7 g/dL (ref 30.0–36.0)
MCV: 85 fL (ref 80.0–100.0)
Platelets: 179 10*3/uL (ref 150–400)
RBC: 4.53 MIL/uL (ref 4.22–5.81)
RDW: 14.3 % (ref 11.5–15.5)
WBC: 10.9 10*3/uL — ABNORMAL HIGH (ref 4.0–10.5)
nRBC: 0 % (ref 0.0–0.2)

## 2021-08-14 LAB — MAGNESIUM: Magnesium: 1.4 mg/dL — ABNORMAL LOW (ref 1.7–2.4)

## 2021-08-14 MED ORDER — APIXABAN 5 MG PO TABS
5.0000 mg | ORAL_TABLET | Freq: Two times a day (BID) | ORAL | Status: DC
  Administered 2021-08-14 – 2021-08-15 (×3): 5 mg via ORAL
  Filled 2021-08-14 (×3): qty 1

## 2021-08-14 MED ORDER — MAGNESIUM SULFATE 4 GM/100ML IV SOLN
4.0000 g | Freq: Once | INTRAVENOUS | Status: AC
  Administered 2021-08-14: 4 g via INTRAVENOUS
  Filled 2021-08-14: qty 100

## 2021-08-14 NOTE — Progress Notes (Signed)
IP PROGRESS NOTE ° °Subjective:  ° °Dan Bell presented to the emergency room yesterday with abdominal distention and lower extremity edema.  He continues to have pain at the right posterior lateral chest.  He was given furosemide.  He feels better today. ° ° °Objective: °Vital signs in last 24 hours: °Blood pressure (!) 137/96, pulse (!) 115, temperature 98.4 °F (36.9 °C), temperature source Oral, resp. rate 20, height 5' 10" (1.778 m), weight 251 lb 12.3 oz (114.2 kg), SpO2 95 %. ° °Intake/Output from previous day: °No intake/output data recorded. ° °Physical Exam: ° °HEENT: No thrush °Lungs: Decreased breath sounds at the right lower posterior chest, no respiratory distress °Cardiac: Regular rate and rhythm °Abdomen: Mildly distended, no hepatomegaly, no mass °Extremities: No leg edema °Skin: Fading acne type rash over the face and trunk ° °Portacath/PICC-without erythema ° °Lab Results: °Recent Labs  °  08/13/21 °0151 08/14/21 °0415  °WBC 12.7* 10.9*  °HGB 13.9 12.6*  °HCT 42.7 38.5*  °PLT 237 179  ° ° °BMET °Recent Labs  °  08/13/21 °0151 08/14/21 °0415  °NA 132* 129*  °K 4.7 4.1  °CL 100 97*  °CO2 24 24  °GLUCOSE 160* 132*  °BUN 19 18  °CREATININE 1.25* 1.20  °CALCIUM 8.9 8.5*  ° ° °Lab Results  °Component Value Date  ° CEA1 7.53 (H) 07/22/2021  ° ° °Studies/Results: °DG Chest 2 View ° °Addendum Date: 08/13/2021   °ADDENDUM REPORT: 08/13/2021 02:00 ADDENDUM: Known pulmonary metastasis.  No acute airspace disease. Electronically Signed   By: Neale  Herman M.D.   On: 08/13/2021 02:00  ° °Result Date: 08/13/2021 °CLINICAL DATA:  Shortness of breath EXAM: CHEST - 2 VIEW COMPARISON:  08/04/2021 FINDINGS: The heart size and mediastinal contours are within normal limits. There are multiple large pulmonary nodules again seen, most clearly in the right lung. The visualized skeletal structures are unremarkable. There is a right chest wall power-injectable Port-A-Cath with tip in the proximal right atrium via a right  internal jugular vein approach. IMPRESSION: Multiple large pulmonary nodules, likely metastatic disease. Electronically Signed: By: Kenny  Herman M.D. On: 08/13/2021 01:54  ° °CT Angio Chest Pulmonary Embolism (PE) W or WO Contrast ° °Result Date: 08/13/2021 °CLINICAL DATA:  Chest pain and shortness of breath. History of metastatic colon cancer. EXAM: CT ANGIOGRAPHY CHEST WITH CONTRAST TECHNIQUE: Multidetector CT imaging of the chest was performed using the standard protocol during bolus administration of intravenous contrast. Multiplanar CT image reconstructions and MIPs were obtained to evaluate the vascular anatomy. RADIATION DOSE REDUCTION: This exam was performed according to the departmental dose-optimization program which includes automated exposure control, adjustment of the mA and/or kV according to patient size and/or use of iterative reconstruction technique. CONTRAST:  70mL OMNIPAQUE IOHEXOL 350 MG/ML SOLN COMPARISON:  08/13/2021 FINDINGS: Cardiovascular: The heart is normal in size. No pericardial effusion. The aorta is normal in caliber. No dissection or atherosclerotic calcification. The branch vessels are patent. No definite coronary artery calcifications. The pulmonary arterial tree is fairly well opacified. No filling defects to suggest pulmonary embolism. Mediastinum/Nodes: Mediastinal and hilar lymphadenopathy. The esophagus is grossly normal. Lungs/Pleura: Diffuse pulmonary metastatic disease. Moderate-sized right pleural effusion with overlying atelectasis. Upper Abdomen: Hepatic metastatic disease with large right hepatic lobe mass. Stable upper abdominal nodes splenomegaly. Musculoskeletal: No significant bony findings. Review of the MIP images confirms the above findings. IMPRESSION: 1. No CT findings for pulmonary embolism. 2. Normal thoracic aorta. 3. Diffuse pulmonary metastatic disease. 4. Moderate-sized right pleural effusion with overlying   atelectasis. 5. Hepatic metastatic disease and  upper abdominal lymphadenopathy. Electronically Signed   By: P.  Gallerani M.D.   On: 08/13/2021 11:55  ° °CT CHEST ABDOMEN PELVIS W CONTRAST ° °Result Date: 08/13/2021 °CLINICAL DATA:  Left lower quadrant pain. History of colon cancer with liver metastases. * onc * EXAM: CT CHEST, ABDOMEN, AND PELVIS WITH CONTRAST TECHNIQUE: Multidetector CT imaging of the chest, abdomen and pelvis was performed following the standard protocol during bolus administration of intravenous contrast. RADIATION DOSE REDUCTION: This exam was performed according to the departmental dose-optimization program which includes automated exposure control, adjustment of the mA and/or kV according to patient size and/or use of iterative reconstruction technique. CONTRAST:  100mL OMNIPAQUE IOHEXOL 300 MG/ML  SOLN COMPARISON:  08/04/2021 FINDINGS: CT CHEST FINDINGS Cardiovascular: The heart size is normal. No substantial pericardial effusion. No thoracic aortic aneurysm. No substantial atherosclerosis of the thoracic aorta. Right Port-A-Cath tip is positioned at the SVC/RA junction. Mediastinum/Nodes: 2.2 cm short axis right hilar node on 24/2 is mildly progressive in the interval. No left hilar lymphadenopathy. The esophagus has normal imaging features. There is no axillary lymphadenopathy. Lungs/Pleura: Bilateral pulmonary metastases again noted. Anterior right upper lobe nodule on 51/3 measures 2.1 cm today compared to 1.9 cm previously (remeasured). Index anterior right upper lobe nodule measured previously at 2.0 cm maximum diameter is now 2.3 cm on 58/3. Index right infrahilar nodule measured previously at 3.7 cm maximum dimension is now 4.3 cm on image 82/3. Index medial left upper lobe nodule measured previously at 2.8 cm is 3.0 cm today on 81/3. Interval development of small right pleural effusion with right base collapse/consolidation. Musculoskeletal: No worrisome lytic or sclerotic osseous abnormality. CT ABDOMEN PELVIS FINDINGS  Hepatobiliary: Large irregular posterior right hepatic lobe lesion measured previously at 11.7 x 10.1 cm is 11.7 x 11.2 cm when remeasured in a similar fashion today. This lesion is contiguous with the intrahepatic IVC and likely involves the right hepatic vein. There is a large filling defect in the intrahepatic IVC extending up to the level of the IVC/RA junction (see image 38/series 2 and coronal 96/4). Gallbladder is nondistended. No intrahepatic or extrahepatic biliary dilation. Pancreas: No focal mass lesion. No dilatation of the main duct. No intraparenchymal cyst. No peripancreatic edema. Spleen: Mild splenomegaly.  No focal mass lesion. Adrenals/Urinary Tract: No adrenal nodule or mass. Kidneys unremarkable. No evidence for hydroureter. The urinary bladder appears normal for the degree of distention. Stomach/Bowel: Stomach is unremarkable. No gastric wall thickening. No evidence of outlet obstruction. Duodenum is normally positioned as is the ligament of Treitz. No small bowel wall thickening. No small bowel dilatation. The terminal ileum is normal. The appendix is not well visualized, but there is no edema or inflammation in the region of the cecum. No gross colonic mass. No colonic wall thickening. Vascular/Lymphatic: No abdominal aortic aneurysm upper normal lymph nodes are seen in the hepatoduodenal ligament. No retroperitoneal lymphadenopathy. No pelvic lymphadenopathy. Portal vein, superior mesenteric vein, and splenic vein are patent. Laminar flow of un opacified blood is seen in the iliac veins and infrarenal IVC. Reproductive: The prostate gland and seminal vesicles are unremarkable. Other: Small volume ascites. Musculoskeletal: No worrisome lytic or sclerotic osseous abnormality. IMPRESSION: 1. Mild interval progression of bilateral pulmonary and right hilar metastases with interval development of small right pleural effusion with right base collapse/consolidation. 2. Mild interval progression of  the large irregular posterior right hepatic lobe conglomerate lesion, contiguous with the intrahepatic IVC. Large filling defect in the   intrahepatic IVC extending up to the level of the IVC/RA junction. In retrospect this was present on prior study of 08/04/2021 but much more subtle due to the arterial phase timing of that CTA exam. Findings today may reflect extension of tumor into the right hepatic vein and then into the IVC versus bland thrombus. 3. Mild splenomegaly again noted. 4. Small volume ascites. Electronically Signed   By: Eric  Mansell M.D.   On: 08/13/2021 07:04   ° °Medications: I have reviewed the patient's current medications. 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, mismatch repair proficient °Colonoscopy 07/27/2019-sigmoid colon mass, biopsy-well differentiated adenocarcinoma °CTs chest, abdomen, pelvis 07/28/2019-numerous liver metastases, mass in the distal sigmoid colon, necrotic appearing node in the sigmoid mesentery, enlarged porta hepatis and portacaval nodes °Markedly elevated CEA 08/01/2019 °Biopsy of liver lesion 08/23/2019-metastatic mucinous adenocarcinoma, CK20 and CDX2 positive °FOLFOX plus bevacizumab, cycle 1 08/24/2019, 11 total cycles, oxaliplatin held with cycle 11 secondary to a reaction with cycle 10 °FOLFOX plus bevacizumab discontinued after cycle 19 secondary to disease progression °FOLFIRI plus bevacizumab beginning 06/20/2020, status post 12 cycles °Disease progression on CT 02/11/2021-enlarging lung nodules and liver lesions °CEA increased 02/13/2021 °CTs 03/11/2021-bulky hypodense liver metastases, slightly progressive, enlargement of bilateral lung nodules, slight enlargement of mediastinal nodes, splenomegaly and gastroesophageal varices °Sotorasib/panitumumab on the Codebreak study beginning 04/01/2021 °CTs 05/21/2021-decrease size of lung and liver metastases, no new lesions °CTs 07/16/2021-new and enlarged lung lesions, increased size of liver  lesions, no new lesions, no new site of metastatic disease °Sotorasib/panitumumab discontinued 07/23/2021 °CTs 08/13/2021-posterior right hepatic lesion contiguous with the intrahepatic IVC, large filling defect in the intrahepatic IVC extending to the RA, mild progression of bilateral pulmonary and right hilar metastasis with development of a small right pleural effusion compared to 08/04/2021, small volume ascites °  °  °Type 2 diabetes °Left lower extremity DVT 2018 °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  Admission 08/13/2021 with increased abdominal distention/leg edema, dyspnea, and pain °  °Dan Bell has metastatic colon cancer.  There is recent clinical and radiologic evidence of disease progression in the liver and lungs.  He has pain at the right posterior lateral chest, likely secondary to the dominant right hepatic mass.  He was admitted with "swelling "and dyspnea.  No evidence for pulmonary embolism on the admission CT.  The swelling is likely related to tumor versus thrombus involving the IVC. ° °I will discuss the case with interventional radiology to see if there is an option for improving IVC flow or anticoagulation therapy. ° °Mr. Vlachos understands systemic treatment options for the cancer are limited.  We have discussed standard salvage therapy with Lonsurf/bevacizumab.  He has been scheduled for an appointment with Dr. Strickler at [facility] to consider clinical trial options. ° °Recommendations: °Continue supportive care °Elevate legs °Replete magnesium-hypomagnesemia secondary to panitumumab therapy °I will arrange for consultation with Dr. Strickler at [facility] °Outpatient follow-up will be scheduled at the Cancer center °I will discuss management of  the IVC occlusion with interventional radiology ° ° LOS: 0 days  ° °Gary Sherrill, MD  ° °08/14/2021, 6:24 AM °  °

## 2021-08-14 NOTE — Assessment & Plan Note (Signed)
-   Patient is not on medications at home ?-We will start hydralazine 25 mg p.o. every 6 hours as needed for BP greater than 160/100 ?

## 2021-08-14 NOTE — Assessment & Plan Note (Signed)
-   Magnesium was 1.4 this morning ?-We will replace magnesium with 4 g of magnesium sulfate IV ?-Follow serum magnesium in a.m. ?

## 2021-08-14 NOTE — Progress Notes (Signed)
?  Transition of Care (TOC) Screening Note ? ? ?Patient Details  ?Name: Dan Bell ?Date of Birth: 09-Feb-1971 ? ? ?Transition of Care [facility]) CM/SW Contact:    ?Dessa Phi, RN ?Phone Number: ?08/14/2021, 11:40 AM ? ? ? ?Transition of Care Department [facility]) has reviewed patient and no TOC needs have been identified at this time. We will continue to monitor patient advancement through interdisciplinary progression rounds. If new patient transition needs arise, please place a TOC consult. ?  ?

## 2021-08-14 NOTE — Assessment & Plan Note (Signed)
-   Patient has history of colon cancer with lung metastasis ?-Dr. Benay Spice is following ?

## 2021-08-14 NOTE — Assessment & Plan Note (Signed)
-   Last hemoglobin A1c was 7.1%, obtained a week ago ?-Started on sliding scale insulin with NovoLog ?-We will hold metformin ?

## 2021-08-14 NOTE — Progress Notes (Signed)
[facility] ? ?PROGRESS NOTE ? ?Dan Bell WER:[id number] DOB: 08-16-1970 DOA: 08/13/2021 ?PCP: Dan Elliot, PA-C ? ?Brief hospital course ? ?51 y.o. male with medical history significant of colon cancer with metastasis to liver and lung, type 2 diabetes, class I obesity, hypertension who is coming to the emergency department with progressively worse dyspnea for the past 2 weeks associated with about a week of abdominal swelling and lower extremity edema for the past 2 days.  Chest x-ray showed metastatic disease.CT chest, abdomen and pelvis with contrast showed progression of pulmonary metastasis and development of a small right pleural effusion with right base collapse/consolidation.  Questionable IVC bland thrombus.  CTA chest done at the request of oncology did not show PE.  ? ? ? ?Subjective  ? ?Patient seen and examined, denies shortness of breath. ? ?  ? ?Assessment and Plan: ? ?* Dyspnea- (present on admission) ?- Secondary to lung metastasis from colon cancer, pleural effusion on right ?-Resolved ?-Patient currently not requiring oxygen ?-Started on Lasix 20 mg IV as needed for dyspnea ?-I do not think that patient needs thoracentesis at this time ? 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 Benay Bell ?-We will start patient on anticoagulation with Eliquis 5 mg p.o. twice daily ? ?Hypomagnesemia- (present on admission) ?- Magnesium was 1.4 this morning ?-We will replace magnesium with 4 g of magnesium sulfate IV ?-Follow serum magnesium in a.m. ? ?HTN (hypertension)- (present on admission) ?- Patient is not on medications at home ?-We will start hydralazine 25 mg p.o. every 6 hours as needed for BP greater than 160/100 ? ?DM2 (diabetes mellitus, type 2) (Dan Bell) ?- Last hemoglobin A1c was 7.1%, obtained a week ago ?-Started on sliding scale  insulin with NovoLog ?-We will hold metformin ? ?Cancer, metastatic to lung Dan Bell)- (present on admission) ?- Patient has history of colon cancer with lung metastasis ?-Dr. Benay Bell is following ? ? ? ? ? ? ?Medications ? ?  ? apixaban  5 mg Oral BID  ? Chlorhexidine Gluconate Cloth  6 each Topical Daily  ? insulin aspart  0-15 Units Subcutaneous TID WC  ? sodium chloride flush  10-40 mL Intracatheter Q12H  ? ? ? Data Reviewed:  ? ?CBG: ? ?Recent Labs  ?Lab 08/13/21 ?1704 08/13/21 ?2235 08/14/21 ?1950 08/14/21 ?1146 08/14/21 ?1646  ?GLUCAP 218* 198* 146* 186* 174*  ? ? ?SpO2: 95 %  ? ? ?Vitals:  ? 08/13/21 1854 08/13/21 2235 08/14/21 0419 08/14/21 1234  ?BP:  (!) 131/98 (!) 137/96 (!) 132/108  ?Pulse:  (!) 103 (!) 115 (!) 112  ?Resp: (!) 23 18 20 20   ?Temp:  98.3 ?F (36.8 ?C) 98.4 ?F (36.9 ?C) 98.3 ?F (36.8 ?C)  ?TempSrc:  Oral Oral Oral  ?SpO2:  91% 95% 95%  ?Weight:      ?Height:      ? ? ? ? ?Data Reviewed: ? ?Basic Metabolic Panel: ?Recent Labs  ?Lab 08/12/21 ?9326 08/13/21 ?0151 08/14/21 ?7124  ?NA 141 132* 129*  ?K 2.5* 4.7 4.1  ?CL 117* 100 97*  ?CO2 15* 24 24  ?GLUCOSE 155* 160* 132*  ?BUN 12 19 18   ?CREATININE 0.57* 1.25* 1.20  ?CALCIUM 5.1* 8.9 8.5*  ?MG 0.6*  --  1.4*  ? ? ?CBC: ?Recent Labs  ?Lab 08/13/21 ?0151 08/14/21 ?5809  ?WBC 12.7* 10.9*  ?NEUTROABS 8.4*  --   ?HGB  13.9 12.6*  ?HCT 42.7 38.5*  ?MCV 84.6 85.0  ?PLT 237 179  ? ? ?LFT ?Recent Labs  ?Lab 08/12/21 ?9458 08/13/21 ?0151  ?AST 16 28  ?ALT 11 22  ?ALKPHOS 148* 322*  ?BILITOT 1.5* 2.7*  ?PROT 3.5* 6.8  ?ALBUMIN 1.9* 3.0*  ? ?  ?Micro :  ? ? ? ?Antibiotics: ?Anti-infectives (From admission, onward)  ? ? None  ? ?  ? ? ? ? ? ?DVT prophylaxis: Apixaban ? ?Code Status: Full code ? ?Family Communication: No family at bedside ? ?CONSULTS: Oncology ? ? ? ?Objective  ? ? ?Physical Examination: ? ? ?General-appears in no acute distress ?Heart-S1-S2, regular, no murmur auscultated ?Lungs-clear to auscultation bilaterally, no wheezing or crackles  auscultated ?Abdomen-soft, nontender, no organomegaly ?Extremities-no edema in the lower extremities ?Neuro-alert, oriented x3, no focal deficit noted ? ?Status is: Inpatient: Right pleural effusion, IVC thrombus ? ? ? ?  ? ? ? ? ? ? ? ?Dan Bell ?  ?[facility] ?If 7PM-7AM, please contact night-coverage at [email or web address], ?Office  [phone] ? ? ?08/14/2021, 6:25 PM  LOS: 0 days  ? ? ? ? ? ? ? ? ? ? ?  ?

## 2021-08-14 NOTE — Hospital Course (Signed)
51 y.o. male with medical history significant of colon cancer with metastasis to liver and lung, type 2 diabetes, class I obesity, hypertension who is coming to the emergency department with progressively worse dyspnea for the past 2 weeks associated with about a week of abdominal swelling and lower extremity edema for the past 2 days.  Chest x-ray showed metastatic disease.CT chest, abdomen and pelvis with contrast showed progression of pulmonary metastasis and development of a small right pleural effusion with right base collapse/consolidation.  Questionable IVC bland thrombus.  CTA chest done at the request of oncology did not show PE. ?

## 2021-08-14 NOTE — Assessment & Plan Note (Signed)
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 Benay Spice ?-We will start patient on anticoagulation with Eliquis 5 mg p.o. twice daily ?

## 2021-08-14 NOTE — Assessment & Plan Note (Signed)
-   Secondary to lung metastasis from colon cancer, pleural effusion on right ?-Resolved ?-Patient currently not requiring oxygen ?-Started on Lasix 20 mg IV as needed for dyspnea ?-I do not think that patient needs thoracentesis at this time ?

## 2021-08-15 ENCOUNTER — Inpatient Hospital Stay (HOSPITAL_COMMUNITY): Payer: Managed Care, Other (non HMO)

## 2021-08-15 ENCOUNTER — Telehealth: Payer: Self-pay | Admitting: *Deleted

## 2021-08-15 ENCOUNTER — Other Ambulatory Visit: Payer: Self-pay | Admitting: *Deleted

## 2021-08-15 ENCOUNTER — Encounter: Payer: Self-pay | Admitting: *Deleted

## 2021-08-15 DIAGNOSIS — C7802 Secondary malignant neoplasm of left lung: Secondary | ICD-10-CM | POA: Diagnosis present

## 2021-08-15 DIAGNOSIS — Z7984 Long term (current) use of oral hypoglycemic drugs: Secondary | ICD-10-CM | POA: Diagnosis not present

## 2021-08-15 DIAGNOSIS — Z79899 Other long term (current) drug therapy: Secondary | ICD-10-CM | POA: Diagnosis not present

## 2021-08-15 DIAGNOSIS — Z6836 Body mass index (BMI) 36.0-36.9, adult: Secondary | ICD-10-CM | POA: Diagnosis not present

## 2021-08-15 DIAGNOSIS — C187 Malignant neoplasm of sigmoid colon: Secondary | ICD-10-CM | POA: Diagnosis present

## 2021-08-15 DIAGNOSIS — I10 Essential (primary) hypertension: Secondary | ICD-10-CM | POA: Diagnosis present

## 2021-08-15 DIAGNOSIS — Z888 Allergy status to other drugs, medicaments and biological substances status: Secondary | ICD-10-CM | POA: Diagnosis not present

## 2021-08-15 DIAGNOSIS — Z20822 Contact with and (suspected) exposure to covid-19: Secondary | ICD-10-CM | POA: Diagnosis present

## 2021-08-15 DIAGNOSIS — R0600 Dyspnea, unspecified: Secondary | ICD-10-CM | POA: Diagnosis present

## 2021-08-15 DIAGNOSIS — E669 Obesity, unspecified: Secondary | ICD-10-CM | POA: Diagnosis present

## 2021-08-15 DIAGNOSIS — R188 Other ascites: Secondary | ICD-10-CM | POA: Diagnosis present

## 2021-08-15 DIAGNOSIS — Z794 Long term (current) use of insulin: Secondary | ICD-10-CM | POA: Diagnosis not present

## 2021-08-15 DIAGNOSIS — I82220 Acute embolism and thrombosis of inferior vena cava: Secondary | ICD-10-CM | POA: Diagnosis present

## 2021-08-15 DIAGNOSIS — E119 Type 2 diabetes mellitus without complications: Secondary | ICD-10-CM | POA: Diagnosis present

## 2021-08-15 DIAGNOSIS — J910 Malignant pleural effusion: Secondary | ICD-10-CM | POA: Diagnosis present

## 2021-08-15 DIAGNOSIS — R7989 Other specified abnormal findings of blood chemistry: Secondary | ICD-10-CM | POA: Diagnosis not present

## 2021-08-15 DIAGNOSIS — Z86718 Personal history of other venous thrombosis and embolism: Secondary | ICD-10-CM | POA: Diagnosis not present

## 2021-08-15 DIAGNOSIS — C787 Secondary malignant neoplasm of liver and intrahepatic bile duct: Secondary | ICD-10-CM | POA: Diagnosis present

## 2021-08-15 DIAGNOSIS — C7801 Secondary malignant neoplasm of right lung: Secondary | ICD-10-CM | POA: Diagnosis present

## 2021-08-15 LAB — BASIC METABOLIC PANEL
Anion gap: 9 (ref 5–15)
BUN: 20 mg/dL (ref 6–20)
CO2: 23 mmol/L (ref 22–32)
Calcium: 8.5 mg/dL — ABNORMAL LOW (ref 8.9–10.3)
Chloride: 97 mmol/L — ABNORMAL LOW (ref 98–111)
Creatinine, Ser: 1.12 mg/dL (ref 0.61–1.24)
GFR, Estimated: >60 mL/min (ref >60)
Glucose, Bld: 136 mg/dL — ABNORMAL HIGH (ref 70–99)
Potassium: 4 mmol/L (ref 3.5–5.1)
Sodium: 129 mmol/L — ABNORMAL LOW (ref 135–145)

## 2021-08-15 LAB — GLUCOSE, CAPILLARY
Glucose-Capillary: 132 mg/dL — ABNORMAL HIGH (ref 70–99)
Glucose-Capillary: 220 mg/dL — ABNORMAL HIGH (ref 70–99)

## 2021-08-15 LAB — CBC
HCT: 37.7 % — ABNORMAL LOW (ref 39.0–52.0)
Hemoglobin: 12.1 g/dL — ABNORMAL LOW (ref 13.0–17.0)
MCH: 27.1 pg (ref 26.0–34.0)
MCHC: 32.1 g/dL (ref 30.0–36.0)
MCV: 84.5 fL (ref 80.0–100.0)
Platelets: 194 10*3/uL (ref 150–400)
RBC: 4.46 MIL/uL (ref 4.22–5.81)
RDW: 14.4 % (ref 11.5–15.5)
WBC: 11.3 10*3/uL — ABNORMAL HIGH (ref 4.0–10.5)
nRBC: 0 % (ref 0.0–0.2)

## 2021-08-15 LAB — MAGNESIUM: Magnesium: 1.6 mg/dL — ABNORMAL LOW (ref 1.7–2.4)

## 2021-08-15 MED ORDER — POLYETHYLENE GLYCOL 3350 17 G PO PACK
17.0000 g | PACK | Freq: Every day | ORAL | Status: DC
  Filled 2021-08-15: qty 1

## 2021-08-15 MED ORDER — HEPARIN SOD (PORK) LOCK FLUSH 100 UNIT/ML IV SOLN
500.0000 [IU] | INTRAVENOUS | Status: AC | PRN
  Administered 2021-08-15: 500 [IU]
  Filled 2021-08-15: qty 5

## 2021-08-15 MED ORDER — OXYCODONE HCL 5 MG PO TABS: 5.0000 mg | ORAL_TABLET | ORAL | 0 refills | Status: AC | PRN

## 2021-08-15 MED ORDER — GADOBUTROL 1 MMOL/ML IV SOLN
10.0000 mL | Freq: Once | INTRAVENOUS | Status: AC | PRN
  Administered 2021-08-15: 10 mL via INTRAVENOUS

## 2021-08-15 MED ORDER — MAGNESIUM OXIDE 400 MG PO CAPS: 400.0000 mg | ORAL_CAPSULE | Freq: Every day | ORAL | 0 refills | Status: AC

## 2021-08-15 MED ORDER — APIXABAN 5 MG PO TABS: 5.0000 mg | ORAL_TABLET | Freq: Two times a day (BID) | ORAL | 2 refills | Status: AC

## 2021-08-15 NOTE — Discharge Summary (Signed)
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 *   Hospital Course: 51 y.o. male with medical history significant of colon cancer with metastasis to liver and lung, type 2 diabetes, class I obesity, hypertension who is coming to the emergency department with progressively worse dyspnea for the past 2 weeks associated with about a week of abdominal swelling and lower extremity edema for the past 2 days.  Chest x-ray showed metastatic disease.CT chest, abdomen and pelvis with contrast showed progression of pulmonary metastasis and development of a small right pleural effusion with right base collapse/consolidation.  Questionable IVC bland thrombus.  CTA chest done at the request of oncology did not show PE.  Assessment and Plan:  * Dyspnea - Secondary to lung metastasis from colon cancer, pleural effusion on right -Resolved -Patient currently not requiring oxygen -Started on Lasix 20 mg IV as needed for dyspnea -I do not think that patient needs thoracentesis at this time  Hypomagnesemia - Magnesium was 1.6 this morning -We will discharge on magnesium oxide 400 mg p.o. daily   HTN (hypertension) - Patient is not on medications at home   DM2 (diabetes mellitus, type 2) (HCC) -Continue home medications  Cancer, metastatic to lung [facility]) - Patient has history of colon cancer with lung metastasis -Dr. Truett Perna is following  Cancer of  sigmoid colon (HCC) - IVC thrombus/extension of tumor in the right hepatic vein/IVC -Seen on CT abdomen/pelvis with contrast -Discussed with oncology, Dr. Truett Perna -We will start patient on anticoagulation with Eliquis 5 mg p.o. twice daily -MRI of abdomen obtained today, Dr. Truett Perna to follow-up as outpatient           Consultants: Oncology Procedures performed:  Disposition: Home Diet recommendation:  Discharge Diet Orders (From admission, onward)     Start     Ordered   08/15/21 0000  Diet - low sodium heart healthy        08/15/21 1428           Regular diet  DISCHARGE MEDICATION: Allergies as of 08/15/2021       Reactions   Empagliflozin Other (See Comments)   GI upset   Fosaprepitant Other (See Comments)   Flushing   Oxaliplatin Other (See Comments)   Facial flush, short of breath        Medication List     TAKE these medications    apixaban 5 MG Tabs tablet Commonly known as: ELIQUIS Take 1 tablet (5 mg total) by mouth 2 (two) times daily.   cyclobenzaprine 5 MG tablet Commonly known as: FLEXERIL Take 1 tablet (5 mg total) by mouth 3 (three) times daily as needed for muscle spasms.   docusate sodium 100 MG capsule Commonly known as: COLACE Take 1 capsule (100 mg total) by mouth daily. What changed: when to take this   FreeStyle Libre 14 Day Sensor Misc SMARTSIG:1 Each Topical Every 2 Weeks   insulin lispro 100  UNIT/ML KwikPen Commonly known as: HUMALOG Inject 5-14 Units into the skin 3 (three) times daily as needed (CBG >200).   Magnesium Oxide 400 MG Caps Take 1 capsule (400 mg total) by mouth daily.   metFORMIN 1000 MG tablet Commonly known as: GLUCOPHAGE Take 1,000 mg by mouth daily with breakfast.   oxyCODONE 5 MG immediate release tablet Commonly known as: Oxy IR/ROXICODONE Take 1 tablet (5 mg total) by mouth every 4 (four) hours as needed for breakthrough pain.   PEN NEEDLES 31GX5/16" 31G X 8 MM Misc Needles for Basaglar  Kwik pens   B-D UF III MINI PEN NEEDLES 31G X 5 MM Misc Generic drug: Insulin Pen Needle USE TO GIVE INSULIN THREE TIMES DAILY   B-D UF III MINI PEN NEEDLES 31G X 5 MM Misc Generic drug: Insulin Pen Needle Inject into the skin.   polyethylene glycol 17 g packet Commonly known as: MIRALAX / GLYCOLAX Take 17 g by mouth daily.   prochlorperazine 10 MG tablet Commonly known as: COMPAZINE Take 10 mg by mouth daily as needed for nausea or vomiting.         Discharge Exam: Filed Weights   08/13/21 0936 08/13/21 1428  Weight: 112 kg 114.2 kg   General-appears in no acute distress Heart-S1-S2, regular, no murmur auscultated Lungs-clear to auscultation bilaterally, no wheezing or crackles auscultated Abdomen-soft, nontender, no organomegaly Extremities-no edema in the lower extremities Neuro-alert, oriented x3, no focal deficit noted  Condition at discharge: good  The results of significant diagnostics from this hospitalization (including imaging, microbiology, ancillary and laboratory) are listed below for reference.   Imaging Studies: DG Chest 2 View  Addendum Date: 08/13/2021   ADDENDUM REPORT: 08/13/2021 02:00 ADDENDUM: Known pulmonary metastasis.  No acute airspace disease. Electronically Signed   By: Deatra Robinson M.D.   On: 08/13/2021 02:00   Result Date: 08/13/2021 CLINICAL DATA:  Shortness of breath EXAM: CHEST - 2 VIEW COMPARISON:  08/04/2021 FINDINGS: The heart size and mediastinal contours are within normal limits. There are multiple large pulmonary nodules again seen, most clearly in the right lung. The visualized skeletal structures are unremarkable. There is a right chest wall power-injectable Port-A-Cath with tip in the proximal right atrium via a right internal jugular vein approach. IMPRESSION: Multiple large pulmonary nodules, likely metastatic disease. Electronically Signed: By: Deatra Robinson M.D. On: 08/13/2021 01:54   DG Chest 2 View  Result Date:  08/04/2021 CLINICAL DATA:  Lambert Mody back pain behind shoulder blades. EXAM: CHEST - 2 VIEW COMPARISON:  Chest plain film, dated February 28, 2007 and chest CT, dated July 16, 2021. FINDINGS: A right-sided venous Port-A-Cath is seen with its distal tip overlying the junction of the superior vena cava and right atrium. A 21 mm x 16 mm well-defined radiopaque lung nodule is seen overlying the mid right lung. A 9 mm diameter lung nodule is also seen overlying the medial aspect of the right upper lobe. These correspond to lung nodules seen within the right lung on the prior chest CT. There is no evidence of acute infiltrate, pleural effusion or pneumothorax. The heart size and mediastinal contours are within normal limits. The visualized skeletal structures are unremarkable. IMPRESSION: 1. Lung nodules within the mid and upper right lung, as described above, consistent with the patient's known history of pulmonary metastasis. 2. No acute infiltrate. Electronically Signed   By: Aram Candela M.D.   On: 08/04/2021 21:18   CT Angio Chest Pulmonary Embolism (PE) W or WO Contrast  Result Date: 08/13/2021 CLINICAL DATA:  Chest pain and shortness of breath. History of metastatic colon cancer. EXAM: CT ANGIOGRAPHY CHEST WITH CONTRAST TECHNIQUE: Multidetector CT imaging of the chest was performed using the standard protocol during bolus administration of intravenous contrast. Multiplanar CT image reconstructions and MIPs were obtained to evaluate the vascular anatomy. RADIATION DOSE REDUCTION: This exam was performed according to the departmental dose-optimization program which includes automated exposure control, adjustment of the mA and/or kV according to patient size and/or use of iterative reconstruction technique. CONTRAST:  70mL OMNIPAQUE IOHEXOL 350 MG/ML SOLN COMPARISON:  08/13/2021 FINDINGS: Cardiovascular: The heart is normal in size. No pericardial effusion. The aorta is normal in caliber. No dissection or  atherosclerotic calcification. The branch vessels are patent. No definite coronary artery calcifications. The pulmonary arterial tree is fairly well opacified. No filling defects to suggest pulmonary embolism. Mediastinum/Nodes: Mediastinal and hilar lymphadenopathy. The esophagus is grossly normal. Lungs/Pleura: Diffuse pulmonary metastatic disease. Moderate-sized right pleural effusion with overlying atelectasis. Upper Abdomen: Hepatic metastatic disease with large right hepatic lobe mass. Stable upper abdominal nodes splenomegaly. Musculoskeletal: No significant bony findings. Review of the MIP images confirms the above findings. IMPRESSION: 1. No CT findings for pulmonary embolism. 2. Normal thoracic aorta. 3. Diffuse pulmonary metastatic disease. 4. Moderate-sized right pleural effusion with overlying atelectasis. 5. Hepatic metastatic disease and upper abdominal lymphadenopathy. Electronically Signed   By: Rudie Meyer M.D.   On: 08/13/2021 11:55   MR THORACIC SPINE W WO CONTRAST  Result Date: 08/06/2021 CLINICAL DATA:  Mid back pain. Metastatic colon cancer with disease progression. Lung metastases. EXAM: MRI THORACIC AND LUMBAR SPINE WITHOUT AND WITH CONTRAST TECHNIQUE: Multiplanar and multiecho pulse sequences of the thoracic and lumbar spine were obtained without and with intravenous contrast. CONTRAST:  10mL GADAVIST GADOBUTROL 1 MMOL/ML IV SOLN COMPARISON:  CT a chest, abdomen, and pelvis 08/04/2021 FINDINGS: MRI THORACIC SPINE FINDINGS Alignment:  No significant listhesis is present. Vertebrae: Nonenhancing hemangioma is at T6 and T11 measure 7 and 11 mm respectively. No in hand sing lesions are present in the thoracic spine. Cord:  Normal signal and morphology. Paraspinal and other soft tissues: Multiple bilateral lung nodules are again noted. 4 cm lung mass is present at the right lung base. Asymmetric right pleural effusion is present. Paraspinous soft tissues are unremarkable. A nonenhancing  cystic lesion is present in the left T8-9 interspace, likely perineural root sleeve cyst. Disc levels: No significant central disc protrusion or central stenosis is present. Right greater than left foraminal narrowing at T10-11 is due to facet hypertrophy. Mild foraminal narrowing is present bilaterally at T11-12 due to facet disease. The central canal and foramina are otherwise patent. MRI LUMBAR SPINE FINDINGS Segmentation: 5 non rib-bearing lumbar type vertebral bodies are present. The lowest fully formed vertebral body is L5. Alignment:  No significant listhesis is present. Vertebrae: Marrow signal and vertebral body heights are normal. No enhancing lesions are present. Conus medullaris: Extends to the L1-2 level and appears normal. Paraspinal and other soft tissues: Limited imaging the abdomen is unremarkable. There is no significant adenopathy. No solid organ lesions are present. Paraspinous musculature is unremarkable. Disc levels: L1-2: Negative. L2-3: Negative. L3-4: Negative. L4-5: Mild disc bulging is present without significant stenosis. L5-S1: Negative. IMPRESSION: 1. No evidence for metastatic disease to the thoracic or lumbar spine. 2. 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 3. Right greater than left foraminal narrowing at T10-11 and T11-12 due to facet hypertrophy. 4. Mild disc bulging at L4-5 without significant  stenosis. 5. Extensive lung nodules documented on recent CTA of the chest. Electronically Signed   By: Marin Roberts M.D.   On: 08/06/2021 12:00   MR Lumbar Spine W Wo Contrast  Result Date: 08/06/2021 CLINICAL DATA:  Mid back pain. Metastatic colon cancer with disease progression. Lung metastases. EXAM: MRI THORACIC AND LUMBAR SPINE WITHOUT AND WITH CONTRAST TECHNIQUE: Multiplanar and multiecho pulse sequences of the thoracic and lumbar spine were obtained without and with intravenous contrast. CONTRAST:  10mL GADAVIST GADOBUTROL 1 MMOL/ML IV SOLN COMPARISON:  CT a chest,  abdomen, and pelvis 08/04/2021 FINDINGS: MRI THORACIC SPINE FINDINGS Alignment:  No significant listhesis is present. Vertebrae: Nonenhancing hemangioma is at T6 and T11 measure 7 and 11 mm respectively. No in hand sing lesions are present in the thoracic spine. Cord:  Normal signal and morphology. Paraspinal and other soft tissues: Multiple bilateral lung nodules are again noted. 4 cm lung mass is present at the right lung base. Asymmetric right pleural effusion is present. Paraspinous soft tissues are unremarkable. A nonenhancing cystic lesion is present in the left T8-9 interspace, likely perineural root sleeve cyst. Disc levels: No significant central disc protrusion or central stenosis is present. Right greater than left foraminal narrowing at T10-11 is due to facet hypertrophy. Mild foraminal narrowing is present bilaterally at T11-12 due to facet disease. The central canal and foramina are otherwise patent. MRI LUMBAR SPINE FINDINGS Segmentation: 5 non rib-bearing lumbar type vertebral bodies are present. The lowest fully formed vertebral body is L5. Alignment:  No significant listhesis is present. Vertebrae: Marrow signal and vertebral body heights are normal. No enhancing lesions are present. Conus medullaris: Extends to the L1-2 level and appears normal. Paraspinal and other soft tissues: Limited imaging the abdomen is unremarkable. There is no significant adenopathy. No solid organ lesions are present. Paraspinous musculature is unremarkable. Disc levels: L1-2: Negative. L2-3: Negative. L3-4: Negative. L4-5: Mild disc bulging is present without significant stenosis. L5-S1: Negative. IMPRESSION: 1. No evidence for metastatic disease to the thoracic or lumbar spine. 2. 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 3. Right greater than left foraminal narrowing at T10-11 and T11-12 due to facet hypertrophy. 4. Mild disc bulging at L4-5 without significant stenosis. 5. Extensive lung nodules documented on recent  CTA of the chest. Electronically Signed   By: Marin Roberts M.D.   On: 08/06/2021 12:00   MR Abdomen W Wo Contrast  Result Date: 08/15/2021 CLINICAL DATA:  Evaluate thrombus within the IVC. Metastatic colon cancer. EXAM: MRI ABDOMEN WITHOUT AND WITH CONTRAST TECHNIQUE: Multiplanar multisequence MR imaging of the abdomen was performed both before and after the administration of intravenous contrast. CONTRAST:  10mL GADAVIST GADOBUTROL 1 MMOL/ML IV SOLN COMPARISON:  08/13/2021 FINDINGS: Lower chest: There is a moderate volume right pleural effusion. Right lower lobe lung nodule measures 4.3 cm, image 8/12. Unchanged from previous exam. Paramediastinal left upper lobe lung nodule measures 3 cm, image 1/20. Unchanged from previous exam. Hepatobiliary: Large confluent mass involving the right hepatic lobe posteriorly is again noted. This measures approximately 13.4 x 11.5 by 16.1 cm, image 20/5 and image 24/3. Tumor extension into the IVC is identified just below the right atrium with tumor thrombus extending into the right atrium, image 23/3. Tumor thrombus also extends caudally to the level of the renal veins without signs of renal vein occlusion, image 50/27. The middle intrahepatic portal vein is significantly attenuated by infiltrative tumor. The right portal vein branch appears completely occluded, image 47/20. Dilatation of the right  lobe of liver biliary radicles identified compatible with central intrahepatic bile duct obstruction. There is no dilatation of the biliary radicles within the left hepatic lobe. Partially decompressed gallbladder with mild gallbladder wall edema. No common bile duct dilatation. Pancreas: No mass, inflammatory changes, or other parenchymal abnormality identified. Spleen: The spleen is enlarged measuring 14.5 cm in length likely reflecting underlying portal venous hypertension. Adrenals/Urinary Tract: Normal adrenal glands. No kidney mass or hydronephrosis identified.  Stomach/Bowel: Stomach appears nondistended. No pathologic dilatation of the visualized large or small bowel loops. Vascular/Lymphatic: Normal caliber of the abdominal aorta. The extrahepatic portal vein, portal venous confluence and splenic vein remain patent. No upper abdominal adenopathy. Other: Small volume of perihepatic ascites is again noted. No discrete fluid collections. Musculoskeletal: No suspicious bone lesions identified. IMPRESSION: 1. Large confluent mass involving the right hepatic lobe posteriorly is again noted compatible with metastatic disease. There is tumor extension into the IVC just below the right atrium with tumor thrombus extending into the right atrium. IVC thrombus also extends caudally to the level of the renal vein inflow without signs of renal vein occlusion. 2. 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 3. Dilatation of intrahepatic biliary radicals within the right hepatic lobe reflecting central intrahepatic biliary obstruction. No dilatation of the left hepatic lobe biliary radicles. 4. Moderate volume right pleural effusion. 5. Similar appearance of right pleural effusion Electronically Signed   By: Signa Kell M.D.   On: 08/15/2021 14:25   CT CHEST ABDOMEN PELVIS W CONTRAST  Result Date: 08/13/2021 CLINICAL DATA:  Left lower quadrant pain. History of colon cancer with liver metastases. * onc * EXAM: CT CHEST, ABDOMEN, AND PELVIS WITH CONTRAST TECHNIQUE: Multidetector CT imaging of the chest, abdomen and pelvis was performed following the standard protocol during bolus administration of intravenous contrast. RADIATION DOSE REDUCTION: This exam was performed according to the departmental dose-optimization program which includes automated exposure control, adjustment of the mA and/or kV according to patient size and/or use of iterative reconstruction technique. CONTRAST:  OMNIPAQUE  IOHEXOL 300 MG/ML  SOLN COMPARISON:  08/04/2021 FINDINGS: CT CHEST FINDINGS Cardiovascular: The heart size is normal. No substantial pericardial effusion. No thoracic aortic aneurysm. No substantial atherosclerosis of the thoracic aorta. Right Port-A-Cath tip is positioned at the SVC/RA junction. Mediastinum/Nodes: 2.2 cm short axis right hilar node on 24/2 is mildly progressive in the interval. No left hilar lymphadenopathy. The esophagus has normal imaging features. There is no axillary lymphadenopathy. Lungs/Pleura: Bilateral pulmonary metastases again noted. Anterior right upper lobe nodule on 51/3 measures 2.1 cm today compared to 1.9 cm previously (remeasured). Index anterior right upper lobe nodule measured previously at 2.0 cm maximum diameter is now 2.3 cm on 58/3. Index right infrahilar nodule measured previously at 3.7 cm maximum dimension is now 4.3 cm on image 82/3. Index medial left upper lobe nodule measured previously at 2.8 cm is 3.0 cm today on 81/3. Interval development of small right pleural effusion with right base collapse/consolidation. Musculoskeletal: No worrisome lytic or sclerotic osseous abnormality. CT ABDOMEN PELVIS FINDINGS Hepatobiliary: Large irregular posterior right hepatic lobe lesion measured previously at 11.7 x 10.1 cm is 11.7 x 11.2 cm when remeasured in a similar fashion today. This lesion is contiguous with the intrahepatic IVC and likely involves the right hepatic vein. There is a large filling defect in the intrahepatic IVC extending up to the level of the IVC/RA junction (see image 38/series 2 and  coronal 96/4). Gallbladder is nondistended. No intrahepatic or extrahepatic biliary dilation. Pancreas: No focal mass lesion. No dilatation of the main duct. No intraparenchymal cyst. No peripancreatic edema. Spleen: Mild splenomegaly.  No focal mass lesion. Adrenals/Urinary Tract: No adrenal nodule or mass. Kidneys unremarkable. No evidence for hydroureter. The urinary bladder  appears normal for the degree of distention. Stomach/Bowel: Stomach is unremarkable. No gastric wall thickening. No evidence of outlet obstruction. Duodenum is normally positioned as is the ligament of Treitz. No small bowel wall thickening. No small bowel dilatation. The terminal ileum is normal. The appendix is not well visualized, but there is no edema or inflammation in the region of the cecum. No gross colonic mass. No colonic wall thickening. Vascular/Lymphatic: No abdominal aortic aneurysm upper normal lymph nodes are seen in the hepatoduodenal ligament. No retroperitoneal lymphadenopathy. No pelvic lymphadenopathy. Portal vein, superior mesenteric vein, and splenic vein are patent. Laminar flow of un opacified blood is seen in the iliac veins and infrarenal IVC. Reproductive: The prostate gland and seminal vesicles are unremarkable. Other: Small volume ascites. Musculoskeletal: No worrisome lytic or sclerotic osseous abnormality. IMPRESSION: 1. Mild interval progression of bilateral pulmonary and right hilar metastases with interval development of small right pleural effusion with right base collapse/consolidation. 2. Mild interval progression of the large irregular posterior right hepatic lobe conglomerate lesion, contiguous with the intrahepatic IVC. Large filling defect in the intrahepatic IVC extending up to the level of the IVC/RA junction. In retrospect this was present on prior study of 08/04/2021 but much more subtle due to the arterial phase timing of that CTA exam. Findings today may reflect extension of tumor into the right hepatic vein and then into the IVC versus bland thrombus. 3. Mild splenomegaly again noted. 4. Small volume ascites. Electronically Signed   By: Kennith Center M.D.   On: 08/13/2021 07:04   DG CHEST PORT 1 VIEW  Result Date: 08/14/2021 CLINICAL DATA:  Dyspnea, pleural effusion EXAM: PORTABLE CHEST 1 VIEW COMPARISON:  08/13/2021 FINDINGS: Right IJ power port catheter tip lower  SVC level. Similar low lung volumes with scattered bilateral pulmonary nodules compatible with known metastases. Basilar atelectasis present, worse on the right. Small right effusion. No pneumothorax. Trachea midline. IMPRESSION: Persistent low lung volumes with known bilateral pulmonary metastases. Similar bibasilar atelectasis and small right pleural effusion. Electronically Signed   By: Judie Petit.  Shick M.D.   On: 08/14/2021 08:05   CT Angio Chest/Abd/Pel for Dissection W and/or Wo Contrast  Result Date: 08/04/2021 CLINICAL DATA:  Acute aortic syndrome (AAS) suspected Acute onset of back pain. Patient with history of stage IV colon cancer. EXAM: CT ANGIOGRAPHY CHEST, ABDOMEN AND PELVIS TECHNIQUE: Non-contrast CT of the chest was initially obtained. Multidetector CT imaging through the chest, abdomen and pelvis was performed using the standard protocol during bolus administration of intravenous contrast. Multiplanar reconstructed images and MIPs were obtained and reviewed to evaluate the vascular anatomy. RADIATION DOSE REDUCTION: This exam was performed according to the departmental dose-optimization program which includes automated exposure control, adjustment of the mA and/or kV according to patient size and/or use of iterative reconstruction technique. CONTRAST:  OMNIPAQUE IOHEXOL 350 MG/ML SOLN COMPARISON:  Chest radiograph earlier today. Recent staging abdominopelvic CT 07/16/2021 FINDINGS: CTA CHEST FINDINGS Cardiovascular: The thoracic aorta is normal in caliber. No aortic dissection, aneurysm, acute aortic syndrome. No aortic hematoma noncontrast exam. No significant atherosclerosis. No periaortic stranding. Right chest port in place. Exam not tailored for evaluation of pulmonary arteries, there is minimal opacification of  the pulmonary arteries on the current exam. Heart is normal in size. Mediastinum/Nodes: 2 cm enlarged right hilar lymph node is new from prior exam. There is small additional  mediastinal lymph nodes are not enlarged by size criteria. Patulous distal esophagus. Lungs/Pleura: Pulmonary metastatic disease with multiple pulmonary nodules. Right lower lobe nodule measures 3.5 x 3.7 cm, series 6, image 38, previously 2.8 x 2.7 cm. Paramediastinal left upper lobe nodule measures 2.8 x 2.3 cm, series 6, image 33, previously 2.2 x 1.7 cm. Anterior right upper lobe nodule measures 1.9 x 2 cm, series 6, image 26, previously 1.7 x 1.4 cm. Many of the additional pulmonary nodules are also larger than on prior exam. No airspace consolidation. Trace pleural thickening without significant effusion. Musculoskeletal: No lytic or blastic osseous lesions. No acute osseous findings to explain pain. No subcutaneous soft tissue abnormalities. Review of the MIP images confirms the above findings. CTA ABDOMEN AND PELVIS FINDINGS VASCULAR Aorta: Normal caliber aorta without aneurysm, dissection, vasculitis or significant stenosis. Mild calcified atherosclerosis. Celiac: Patent without evidence of aneurysm, dissection, vasculitis or significant stenosis. SMA: Patent without evidence of aneurysm, dissection, vasculitis or significant stenosis. Renals: 2 right renal arteries with small artery supplying the upper pole. Single left renal artery with early branching. All renal arteries are patent IMA: Patent. Inflow: Patent without evidence of aneurysm, dissection, vasculitis or significant stenosis. Veins: Not well assessed on this arterial phase exam. Review of the MIP images confirms the above findings. NON-VASCULAR Hepatobiliary: Patient's known hepatic metastatic disease is not well assessed on this arterial phase exam. Confluent posterior right lobe liver lesion measures approximately 11.7 x 10.1 cm, unchanged from prior exam. Confluent lesion in the inferior right hepatic lobe is difficult to measure. Many of the additional liver lesions are not well-defined on this arterial phase exam. Gallbladder is partially  distended. There is no gallstone or gallbladder wall thickening, however there is mild no common bile duct dilatation. Stranding of the pericholecystic fat. Pancreas: No ductal dilatation or inflammation. No evidence of pancreatic mass. Spleen: Splenomegaly is again seen. The spleen measures 17.9 x 6.0 x 10.8 cm (volume = 610 cm^3), previous splenic volume was 943 cc. Normal splenic arterial enhancement. Adrenals/Urinary Tract: No adrenal nodules. There is no hydronephrosis. Moderate bilateral perinephric edema, new in the interim. Small amount of fluid tracks into the anterior pararenal spaces and retroperitoneum. No visualized renal calculi. Urinary bladder is decompressed, no obvious bladder wall thickening. Stomach/Bowel: Lack of enteric contrast and arterial phase imaging limits detailed assessment. May be a tiny hiatal hernia. The stomach is decompressed. There is no small bowel obstruction or evidence of small bowel inflammation. Normal appendix. Small to moderate colonic stool burden. No colonic wall thickening or colonic inflammation. Lymphatic: No enlarged abdominopelvic lymph nodes. Reproductive: No acute findings. Other: No ascites. No evidence of omental thickening. There is a tiny fat containing umbilical hernia. Musculoskeletal: No lytic or blastic osseous lesions. No acute osseous findings. Review of the MIP images confirms the above findings. IMPRESSION: 1. No aortic dissection or acute aortic abnormality. 2. Progression in thoracic metastatic disease with increased size of multiple bilateral pulmonary nodules. There is a new enlarged right hilar lymph node. 3. Patient's known hepatic metastatic disease is less well-defined on this arterial phase exam, confluent hepatic metastasis are grossly similar. 4. Moderate bilateral perinephric edema with trace fluid tracking into the anterior pararenal spaces and retroperitoneum. This is nonspecific, and there are no specific findings of pyelonephritis on  this arterial phase exam. 5.  Splenomegaly. Electronically Signed   By: Narda Rutherford M.D.   On: 08/04/2021 23:38    Microbiology: Results for orders placed or performed during the hospital encounter of 08/13/21  Resp Panel by RT-PCR (Flu A&B, Covid) Nasopharyngeal Swab     Status: None   Collection Time: 08/13/21  6:51 AM   Specimen: Nasopharyngeal Swab; Nasopharyngeal(NP) swabs in vial transport medium  Result Value Ref Range Status   SARS Coronavirus 2 by RT PCR NEGATIVE NEGATIVE Final    Comment: (NOTE) SARS-CoV-2 target nucleic acids are NOT DETECTED.  The SARS-CoV-2 RNA is generally detectable in upper respiratory specimens during the acute phase of infection. The lowest concentration of SARS-CoV-2 viral copies this assay can detect is 138 copies/mL. A negative result does not preclude SARS-Cov-2 infection and should not be used as the sole basis for treatment or other patient management decisions. A negative result may occur with  improper specimen collection/handling, submission of specimen other than nasopharyngeal swab, presence of viral mutation(s) within the areas targeted by this assay, and inadequate number of viral copies(<138 copies/mL). A negative result must be combined with clinical observations, patient history, and epidemiological information. The expected result is Negative.  Fact Sheet for Patients:  BloggerCourse.com  Fact Sheet for Healthcare Providers:  SeriousBroker.it  This test is no t yet approved or cleared by the [place] FDA and  has been authorized for detection and/or diagnosis of SARS-CoV-2 by FDA under an Emergency Use Authorization (EUA). This EUA will remain  in effect (meaning this test can be used) for the duration of the COVID-19 declaration under Section 564(b)(1) of the Act, 21 U.S.C.section 360bbb-3(b)(1), unless the authorization is terminated  or revoked sooner.        Influenza A by PCR NEGATIVE NEGATIVE Final   Influenza B by PCR NEGATIVE NEGATIVE Final    Comment: (NOTE) The Xpert Xpress SARS-CoV-2/FLU/RSV plus assay is intended as an aid in the diagnosis of influenza from Nasopharyngeal swab specimens and should not be used as a sole basis for treatment. Nasal washings and aspirates are unacceptable for Xpert Xpress SARS-CoV-2/FLU/RSV testing.  Fact Sheet for Patients: BloggerCourse.com  Fact Sheet for Healthcare Providers: SeriousBroker.it  This test is not yet approved or cleared by the [place] FDA and has been authorized for detection and/or diagnosis of SARS-CoV-2 by FDA under an Emergency Use Authorization (EUA). This EUA will remain in effect (meaning this test can be used) for the duration of the COVID-19 declaration under Section 564(b)(1) of the Act, 21 U.S.C. section 360bbb-3(b)(1), unless the authorization is terminated or revoked.  Performed at [facility], [address], [place], [place] [address]     Labs: CBC: Recent Labs  Lab 08/13/21 0151 08/14/21 0415 08/15/21 0425  WBC 12.7* 10.9* 11.3*  NEUTROABS 8.4*  --   --   HGB 13.9 12.6* 12.1*  HCT 42.7 38.5* 37.7*  MCV 84.6 85.0 84.5  PLT 237 179 194   Basic Metabolic Panel: Recent Labs  Lab 08/12/21 0944 08/13/21 0151 08/14/21 0415 08/15/21 0425 08/15/21 1216  NA 141 132* 129* 129*  --   K 2.5* 4.7 4.1 4.0  --   CL 117* 100 97* 97*  --   CO2 15* 24 24 23   --   GLUCOSE 155* 160* 132* 136*  --   BUN 12 19 18 20   --   CREATININE 0.57* 1.25* 1.20 1.12  --   CALCIUM 5.1* 8.9 8.5* 8.5*  --   MG 0.6*  --  1.4*  --  1.6*   Liver Function Tests: Recent Labs  Lab 08/12/21 0944 08/13/21 0151  AST 16 28  ALT 11 22  ALKPHOS 148* 322*  BILITOT 1.5* 2.7*  PROT 3.5* 6.8  ALBUMIN 1.9* 3.0*   CBG: Recent Labs  Lab 08/14/21 1146 08/14/21 1646 08/14/21 2112 08/15/21 0736  08/15/21 1131  GLUCAP 186* 174* 200* 132* 220*    Discharge time spent: greater than 30 minutes.  Signed: Meredeth Ide, MD [facility] 08/15/2021

## 2021-08-15 NOTE — Discharge Planning (Signed)
Oncology Discharge Planning Note ? ?[facility] ?Address: [address] [place], [place], [place] [address] ?Hours of Operation:  8am - 5pm, Monday - Friday  ?Clinic Contact Information:  [phone]) [phone] ? ?Oncology Care Team: ?Medical Oncologist:  Dr. Betsy Coder ? ?Patient Details: ?Name:  Dan Bell, Dan Bell ?MRN:   [id number] ?DOB:   [birth year] ?Reason for Current Admission: Dyspnea ? ?Discharge Planning Narrative: ?Notification of admission received by MD for [name].  Discharge follow-up appointments for oncology are current and available on the AVS and MyChart.   ?Upon discharge from the hospital, hematology/oncology's post discharge plan of care for the outpatient setting is: Follow up with Dr. Reynaldo Minium at [facility] on 08/16/21 and return to Dr. Benay Spice on 08/19/24 for lab and office visit.  ? ?[name] will be called within two business days after discharge to review hematology/oncology's plan of care for full understanding.   ? ?Outpatient Oncology Specific Care Only: ?Oncology appointment transportation needs addressed?:  not applicable ?Oncology medication management for symptom management addressed?:  yes ?Chemo Alert Card reviewed?:  no ?Immunotherapy Alert Card reviewed?:  not applicable ?

## 2021-08-15 NOTE — Progress Notes (Addendum)
IP PROGRESS NOTE  Subjective:   Dan Bell continues to have right flank pain.  He continues to have some abdominal distention.  Objective: Vital signs in last 24 hours: Blood pressure 129/84, pulse (!) 109, temperature 100 F (37.8 C), temperature source Oral, resp. rate 16, height '5\' 10"'  (1.778 m), weight 114.2 kg, SpO2 93 %.  Intake/Output from previous day: 03/01 0701 - 03/02 0700 In: 100 [IV Piggyback:100] Out: -   Physical Exam:  HEENT: No thrush Lungs: Decreased breath sounds at the right lower posterior chest, no respiratory distress Cardiac: Regular rate and rhythm Abdomen: Mildly distended, no hepatomegaly, no mass Extremities: No leg edema Skin: Fading acne type rash over the face and trunk  Portacath/PICC-without erythema  Lab Results: Recent Labs    08/14/21 0415 08/15/21 0425  WBC 10.9* 11.3*  HGB 12.6* 12.1*  HCT 38.5* 37.7*  PLT 179 194    BMET Recent Labs    08/14/21 0415 08/15/21 0425  NA 129* 129*  K 4.1 4.0  CL 97* 97*  CO2 24 23  GLUCOSE 132* 136*  BUN 18 20  CREATININE 1.20 1.12  CALCIUM 8.5* 8.5*    Lab Results  Component Value Date   CEA1 7.53 (H) 07/22/2021    Studies/Results: CT Angio Chest Pulmonary Embolism (PE) W or WO Contrast  Result Date: 08/13/2021 CLINICAL DATA:  Chest pain and shortness of breath. History of metastatic colon cancer. EXAM: CT ANGIOGRAPHY CHEST WITH CONTRAST TECHNIQUE: Multidetector CT imaging of the chest was performed using the standard protocol during bolus administration of intravenous contrast. Multiplanar CT image reconstructions and MIPs were obtained to evaluate the vascular anatomy. RADIATION DOSE REDUCTION: This exam was performed according to the departmental dose-optimization program which includes automated exposure control, adjustment of the mA and/or kV according to patient size and/or use of iterative reconstruction technique. CONTRAST:  30m OMNIPAQUE IOHEXOL 350 MG/ML SOLN COMPARISON:   08/13/2021 FINDINGS: Cardiovascular: The heart is normal in size. No pericardial effusion. The aorta is normal in caliber. No dissection or atherosclerotic calcification. The branch vessels are patent. No definite coronary artery calcifications. The pulmonary arterial tree is fairly well opacified. No filling defects to suggest pulmonary embolism. Mediastinum/Nodes: Mediastinal and hilar lymphadenopathy. The esophagus is grossly normal. Lungs/Pleura: Diffuse pulmonary metastatic disease. Moderate-sized right pleural effusion with overlying atelectasis. Upper Abdomen: Hepatic metastatic disease with large right hepatic lobe mass. Stable upper abdominal nodes splenomegaly. Musculoskeletal: No significant bony findings. Review of the MIP images confirms the above findings. IMPRESSION: 1. No CT findings for pulmonary embolism. 2. Normal thoracic aorta. 3. Diffuse pulmonary metastatic disease. 4. Moderate-sized right pleural effusion with overlying atelectasis. 5. Hepatic metastatic disease and upper abdominal lymphadenopathy. Electronically Signed   By: PMarijo SanesM.D.   On: 08/13/2021 11:55   DG CHEST PORT 1 VIEW  Result Date: 08/14/2021 CLINICAL DATA:  Dyspnea, pleural effusion EXAM: PORTABLE CHEST 1 VIEW COMPARISON:  08/13/2021 FINDINGS: Right IJ power port catheter tip lower SVC level. Similar low lung volumes with scattered bilateral pulmonary nodules compatible with known metastases. Basilar atelectasis present, worse on the right. Small right effusion. No pneumothorax. Trachea midline. IMPRESSION: Persistent low lung volumes with known bilateral pulmonary metastases. Similar bibasilar atelectasis and small right pleural effusion. Electronically Signed   By: MJerilynn Mages  Shick M.D.   On: 08/14/2021 08:05    Medications: I have reviewed the patient's current medications.  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, mismatch repair proficient Colonoscopy 07/27/2019-sigmoid colon mass,  biopsy-well  differentiated adenocarcinoma CTs chest, abdomen, pelvis 07/28/2019-numerous liver metastases, mass in the distal sigmoid colon, necrotic appearing node in the sigmoid mesentery, enlarged porta hepatis and portacaval nodes Markedly elevated CEA 08/01/2019 Biopsy of liver lesion 08/23/2019-metastatic mucinous adenocarcinoma, CK20 and CDX2 positive FOLFOX plus bevacizumab, cycle 1 08/24/2019, 11 total cycles, oxaliplatin held with cycle 11 secondary to a reaction with cycle 10 FOLFOX plus bevacizumab discontinued after cycle 19 secondary to disease progression FOLFIRI plus bevacizumab beginning 06/20/2020, status post 12 cycles Disease progression on CT 02/11/2021-enlarging lung nodules and liver lesions CEA increased 02/13/2021 CTs 03/11/2021-bulky hypodense liver metastases, slightly progressive, enlargement of bilateral lung nodules, slight enlargement of mediastinal nodes, splenomegaly and gastroesophageal varices Sotorasib/panitumumab on the Codebreak study beginning 04/01/2021 CTs 05/21/2021-decrease size of lung and liver metastases, no new lesions CTs 07/16/2021-new and enlarged lung lesions, increased size of liver lesions, no new lesions, no new site of metastatic disease Sotorasib/panitumumab discontinued 07/23/2021 CTs 08/13/2021-posterior right hepatic lesion contiguous with the intrahepatic IVC, large filling defect in the intrahepatic IVC extending to the RA, mild progression of bilateral pulmonary and right hilar metastasis with development of a small right pleural effusion compared to 08/04/2021, small volume ascites     Type 2 diabetes Left lower extremity DVT 2018 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  Admission 08/13/2021 with increased  abdominal distention/leg edema, dyspnea, and pain   Dan Bell appears stable.  There is recent clinical and radiological evidence of disease progression in the liver and lungs.  He has pain at the right posterior lateral chest which may be due to the dominant right hepatic mass.  He has swelling and dyspnea on admission CT did not show any evidence of PE.  However, there is tumor versus thrombus in the IVC.  Imaging results were discussed with radiology who recommend further evaluation with MRI of the abdomen with and without contrast to further evaluate for mural thrombus versus tumor thrombus.  I have ordered the MRI and hopefully this can be done today.  Would continue him on Eliquis pending further work-up.  Dan Bell understands systemic treatment options for the cancer are limited.  We have discussed standard salvage therapy with Lonsurf/bevacizumab.  He has been scheduled for an appointment with Dr. Reynaldo Minium at [facility] to consider clinical trial options.  He has a virtual visit scheduled for tomorrow.  Recommendations: Continue supportive care Elevate legs MRI abdomen with and without contrast to evaluate for mural thrombus versus tumor thrombus. A consultation with Dr. Reynaldo Minium has been scheduled for tomorrow at [facility]. Outpatient follow-up will be scheduled at the Cancer center Continue apixaban anticoagulation  Okay to discharge from our standpoint.  Would prefer to get the MRI of the abdomen today if possible, but if this cannot be done, we can arrange to have this done as an outpatient.   LOS: 0 days   Mikey Bussing, NP   08/15/2021, 8:46 AM Dan Bell was interviewed and examined.  His overall status has improved since hospital admission.  He has pain at the right upper anterior chest this morning.  The pain is likely related to an anterior pleural-based mass in the right upper chest.  I discussed the case with interventional radiology.  It is unclear whether the VSee  occlusion is related to bland thrombus versus tumor.  An MRI is recommended  for further evaluation.  I recommend continuing apixaban anticoagulation.  We discussed the risk of bleeding.  Dan Bell agrees to continue apixaban.  He is scheduled for consultation with Dr. Reynaldo Minium tomorrow.  Outpatient follow-up will be scheduled at the Cancer center for next week.  I was present for greater than 50% of today's visit.  I performed medical decision making.  Julieanne Manson, MD

## 2021-08-15 NOTE — Telephone Encounter (Signed)
Mrs. Sprinkle works for [employer] and needs MD to dictate letter that she requires continuous FMLA to care for her husband with serious health condition. FMLA start date is 08/05/21 and for at least 12 weeks.  ?Fax to [phone] att: Orlie Dakin in [place]. ?

## 2021-08-15 NOTE — Discharge Instructions (Addendum)
Information on my medicine - ELIQUIS? (apixaban) ? ?Why was Eliquis? prescribed for you? ?Eliquis? was prescribed to treat blood clots that may have been found in the veins of your legs (deep vein thrombosis) or in your lungs (pulmonary embolism) and to reduce the risk of them occurring again. ? ?What do You need to know about Eliquis? ? ?The dose is ONE 5 mg tablet taken TWICE daily.  Eliquis? may be taken with or without food.  ? ?Try to take the dose about the same time in the morning and in the evening. If you have difficulty swallowing the tablet whole please discuss with your pharmacist how to take the medication safely. ? ?Take Eliquis? exactly as prescribed and DO NOT stop taking Eliquis? without talking to the doctor who prescribed the medication.  Stopping may increase your risk of developing a new blood clot.  Refill your prescription before you run out. ? ?After discharge, you should have regular check-up appointments with your healthcare provider that is prescribing your Eliquis?. ?   ?What do you do if you miss a dose? ?If a dose of ELIQUIS? is not taken at the scheduled time, take it as soon as possible on the same day and twice-daily administration should be resumed. The dose should not be doubled to make up for a missed dose. ? ?Important Safety Information ?A possible side effect of Eliquis? is bleeding. You should call your healthcare provider right away if you experience any of the following: ?Bleeding from an injury or your nose that does not stop. ?Unusual colored urine (red or dark brown) or unusual colored stools (red or black). ?Unusual bruising for unknown reasons. ?A serious fall or if you hit your head (even if there is no bleeding). ? ?Some medicines may interact with Eliquis? and might increase your risk of bleeding or clotting while on Eliquis?Marland Kitchen To help avoid this, consult your healthcare provider or pharmacist prior to using any new prescription or non-prescription medications,  including herbals, vitamins, non-steroidal anti-inflammatory drugs (NSAIDs) and supplements. ? ?This website has more information on Eliquis? (apixaban): http://www.eliquis.com/eliquis/home ? ? ?PER MEDICAL ONCOLOGY: Follow up with Dr. Reynaldo Minium tomorrow as scheduled. See Dr. Benay Spice on 08/19/21 at 0900 for lab/port flush and office visit. ?

## 2021-08-16 ENCOUNTER — Inpatient Hospital Stay: Payer: Managed Care, Other (non HMO) | Admitting: Nurse Practitioner

## 2021-08-16 ENCOUNTER — Inpatient Hospital Stay: Payer: Managed Care, Other (non HMO)

## 2021-08-19 ENCOUNTER — Encounter: Payer: Self-pay | Admitting: *Deleted

## 2021-08-19 ENCOUNTER — Inpatient Hospital Stay: Payer: Managed Care, Other (non HMO)

## 2021-08-19 ENCOUNTER — Inpatient Hospital Stay: Payer: Managed Care, Other (non HMO) | Admitting: Oncology

## 2021-08-19 ENCOUNTER — Telehealth: Payer: Self-pay | Admitting: *Deleted

## 2021-08-19 NOTE — Telephone Encounter (Signed)
Left VM regarding cancellation of appointment today and that scheduler reports he has another 2nd opinion visit this week. Asked if he needs [place] to send any records to assist with this referral? Also informed him the [facility] letter Mrs. Glaspy requested is ready--will she pick it up or prefer it be mailed to home? ?

## 2021-08-19 NOTE — Progress Notes (Signed)
Mrs. Brideau requested her FMLA letter be faxed to her employer att: Clemetine Marker in [place] [phone] and this was done as requested. ?Received request for records today from "[employer]" in [place], [place] for records. Patient made a self-referral there. Patient did sign a ROI for records and these were faxed as requested to [phone]. ?

## 2021-08-20 ENCOUNTER — Inpatient Hospital Stay: Payer: Managed Care, Other (non HMO)

## 2021-08-20 ENCOUNTER — Inpatient Hospital Stay: Payer: Managed Care, Other (non HMO) | Admitting: Oncology

## 2021-08-20 ENCOUNTER — Telehealth: Payer: Self-pay | Admitting: Oncology

## 2021-08-20 NOTE — Telephone Encounter (Signed)
Attempted to contact patient in regards to in basket message from [place]. No answer so voicemail was left for patient to call back. ?

## 2021-08-21 ENCOUNTER — Encounter: Payer: Self-pay | Admitting: *Deleted

## 2021-08-21 NOTE — Progress Notes (Unsigned)
Per Dr. Leanna Sato office at [facility] he looks eligible for soon to close clinical trial. Need his [place] pathology report sent to [phone] or email to [email or web address]. ?Report emailed to research staff as requested. ?

## 2021-08-26 ENCOUNTER — Encounter: Payer: Self-pay | Admitting: *Deleted

## 2021-08-26 NOTE — Progress Notes (Signed)
Notified by research nurse that patient died on 09-05-21. MD notified. ?

## 2021-08-29 ENCOUNTER — Other Ambulatory Visit: Payer: Managed Care, Other (non HMO)

## 2021-08-29 ENCOUNTER — Inpatient Hospital Stay: Payer: Managed Care, Other (non HMO) | Admitting: Oncology

## 2021-08-29 ENCOUNTER — Inpatient Hospital Stay: Payer: Managed Care, Other (non HMO)

## 2021-09-14 DEATH — deceased

## 2023-02-05 IMAGING — MR MR ABDOMEN WO/W CM
19 series · 48 of 48 positions shown · IV contrast (10 GADAVIST)
Comparison: 08/13/2021

CLINICAL DATA: Evaluate thrombus within the IVC. Metastatic colon
cancer.

EXAM:
MRI ABDOMEN WITHOUT AND WITH CONTRAST
TECHNIQUE: Multiplanar multisequence MR imaging of the abdomen was performed
both before and after the administration of intravenous contrast.
CONTRAST:  10mL GADAVIST GADOBUTROL 1 MMOL/ML IV SOLN

[Series 3: T2 · coronal · 6.0mm · 1.56mm/px · 2 of 36 slices shown (1 of 2)]
[im 1/36]
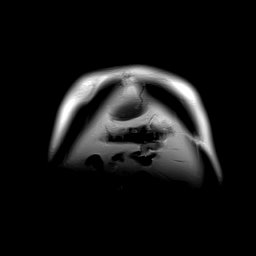
[im 36/36]
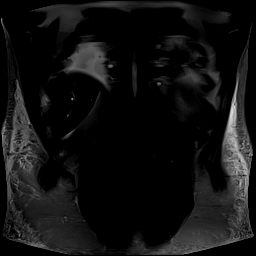

[Series 4: T2 fat-sat · 2 of 7 slices shown (1 of 2)]
[im 1/7]
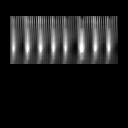
[im 7/7]
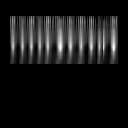

[Series 5: T2 fat-sat · axial · 6.0mm · 1.25mm/px · 1 of 39 slices shown (2 of 2)]
[im 1/39]
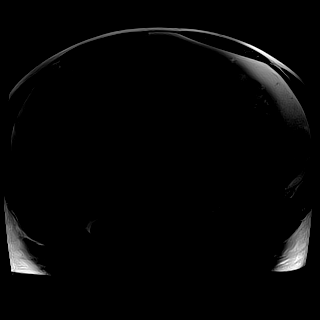

[Series 6: T1 · axial · 3.0mm · 1.25mm/px · z∈[-148,+137]mm · 3 of 96 slices shown (1 of 2)]
[im 1/96]
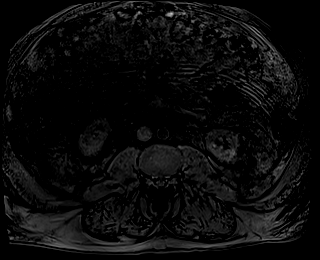
[im 48/96]
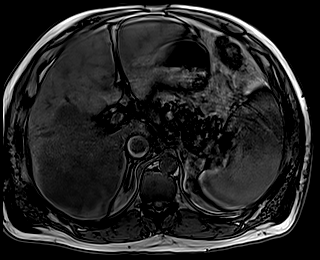
[im 96/96]
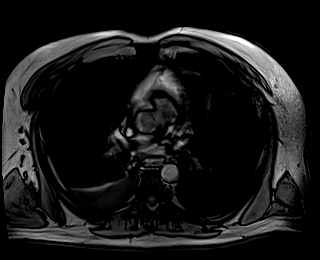

[Series 7: T1 · axial · 3.0mm · 1.25mm/px · z∈[-148,+137]mm · 3 of 96 slices shown (2 of 2)]
[im 1/96]
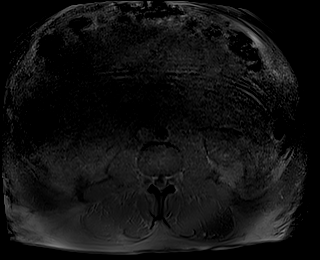
[im 48/96]
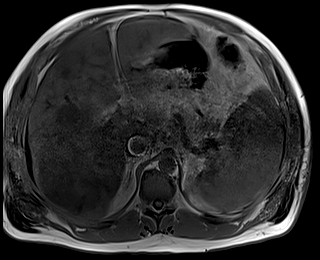
[im 96/96]
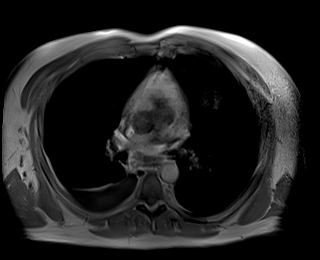

[Series 8: DWI · axial · 6.0mm · 1.49mm/px · z∈[-147,+134]mm · 3 of 80 slices shown (1 of 2)]
[im 1/80]
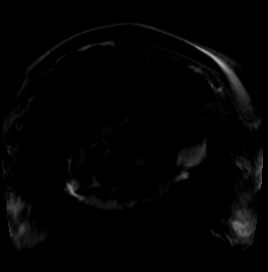
[im 40/80]
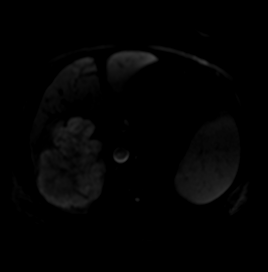
[im 80/80]
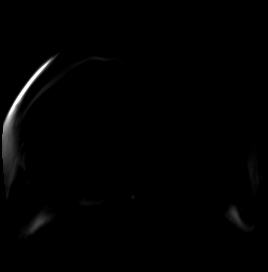

[Series 9: DWI · axial · 6.0mm · 1.49mm/px · 1 of 40 slices shown (2 of 2)]
[im 1/40]
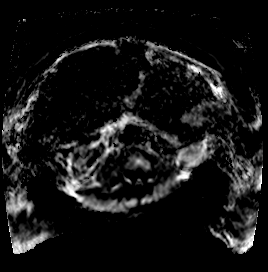

[Series 10: bSSFP · axial · 4.0mm · 0.84mm/px · z∈[-137,+127]mm · 2 of 67 slices shown]
[im 1/67]
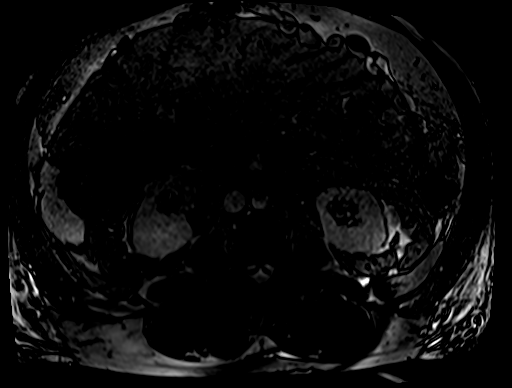
[im 67/67]
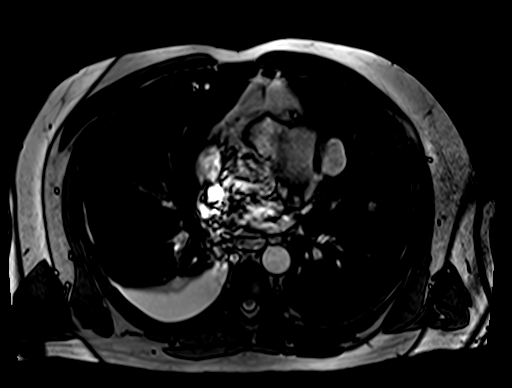

[Series 12: T1 dynamic · axial · 3.0mm · 1.25mm/px · z∈[-135,+126]mm · 3 of 88 slices shown (1 of 10)]
[im 1/88]
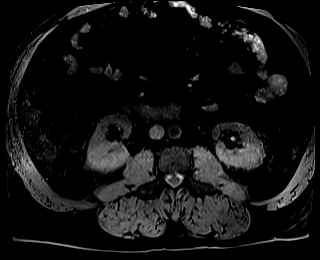
[im 44/88]
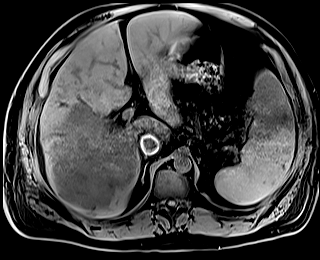
[im 88/88]
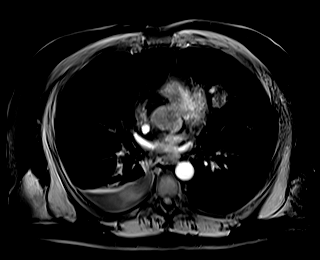

[Series 16: T1 dynamic · axial · 3.0mm · 1.25mm/px · z∈[-135,+126]mm · 3 of 88 slices shown (2 of 10)]
[im 1/88]
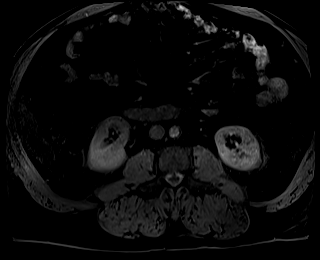
[im 44/88]
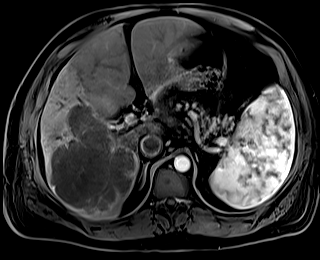
[im 88/88]
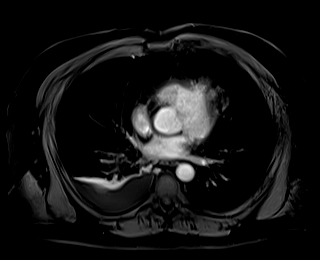

[Series 17: T1 dynamic · axial · 3.0mm · 1.25mm/px · z∈[-135,+126]mm · 3 of 88 slices shown (3 of 10)]
[im 1/88]
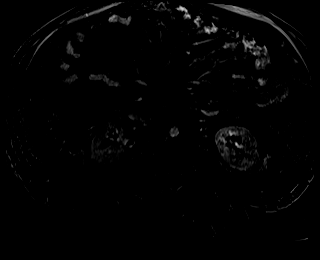
[im 44/88]
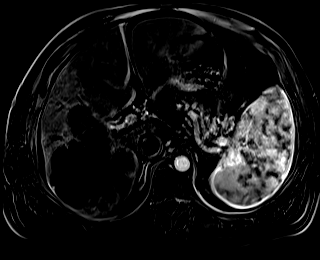
[im 88/88]
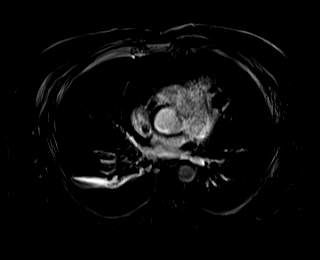

[Series 20: T1 dynamic · axial · 3.0mm · 1.25mm/px · z∈[-135,+126]mm · 3 of 88 slices shown (4 of 10)]
[im 1/88]
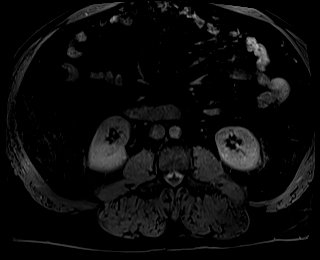
[im 44/88]
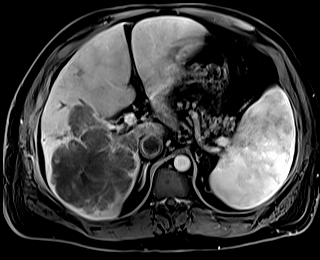
[im 88/88]
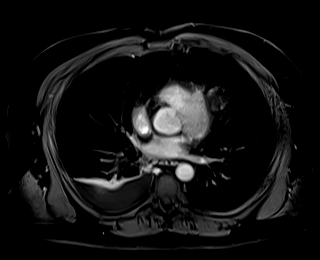

[Series 21: T1 dynamic · axial · 3.0mm · 1.25mm/px · z∈[-135,+126]mm · 3 of 88 slices shown (5 of 10)]
[im 1/88]
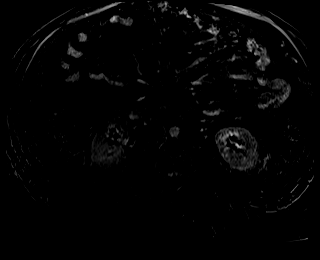
[im 44/88]
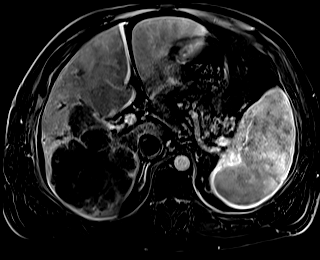
[im 88/88]
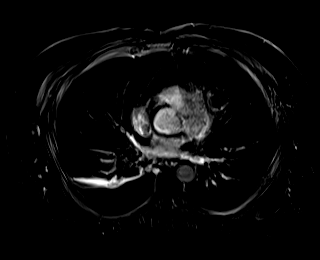

[Series 24: T1 dynamic · axial · 3.0mm · 1.25mm/px · z∈[-135,+126]mm · 3 of 88 slices shown (6 of 10)]
[im 1/88]
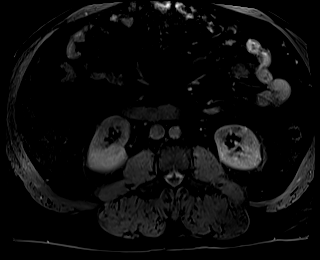
[im 44/88]
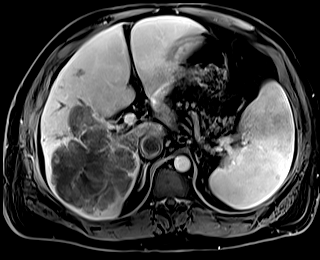
[im 88/88]
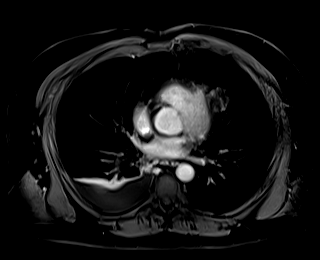

[Series 25: T1 dynamic · axial · 3.0mm · 1.25mm/px · z∈[-135,+126]mm · 3 of 88 slices shown (7 of 10)]
[im 1/88]
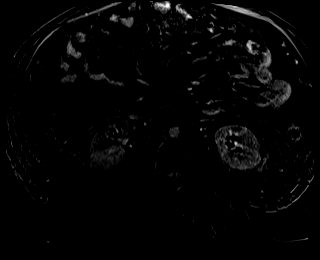
[im 44/88]
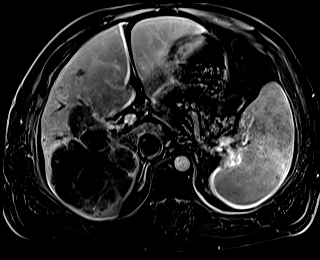
[im 88/88]
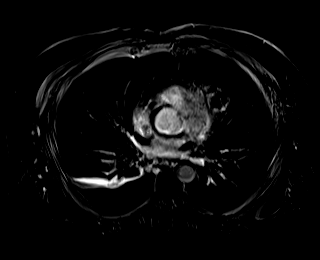

[Series 27: T1 dynamic · coronal · 3.0mm · 1.41mm/px · 3 of 80 slices shown (8 of 10)]
[im 1/80]
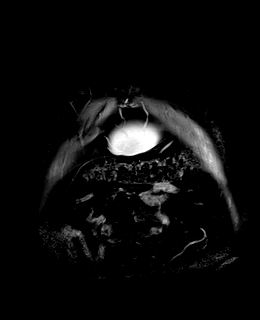
[im 40/80]
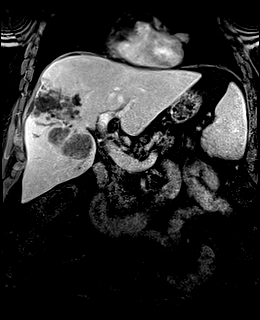
[im 80/80]
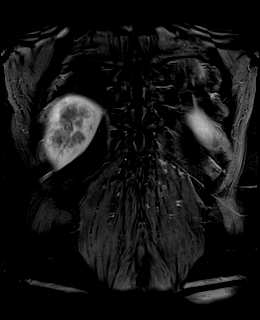

[Series 28: T2 · axial · 6.0mm · 1.56mm/px · 1 of 34 slices shown (2 of 2)]
[im 1/34]
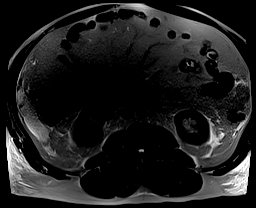

[Series 31: T1 dynamic · axial · 3.0mm · 1.25mm/px · z∈[-135,+126]mm · 3 of 88 slices shown (9 of 10)]
[im 1/88]
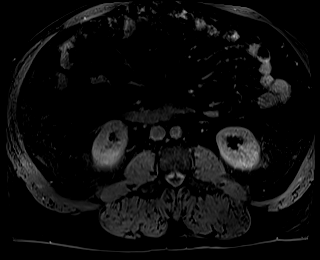
[im 44/88]
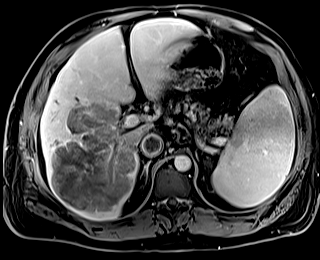
[im 88/88]
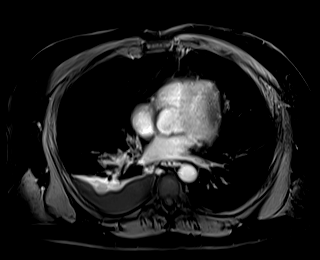

[Series 32: T1 dynamic · axial · 3.0mm · 1.25mm/px · z∈[-135,+126]mm · 3 of 88 slices shown (10 of 10)]
[im 1/88]
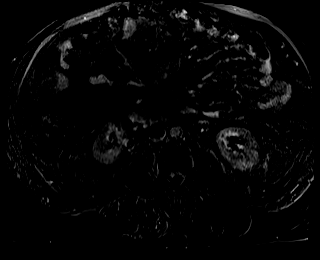
[im 44/88]
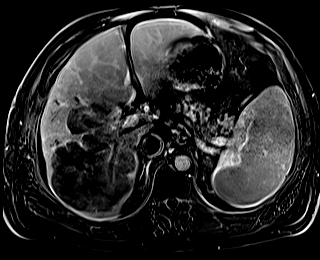
[im 88/88]
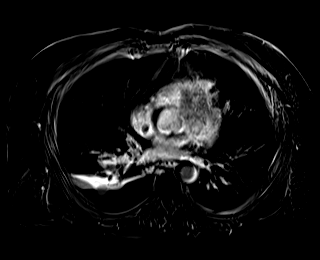

[48 of 48 positions shown; findings below may reference images not displayed]

FINDINGS: Lower chest: There is a moderate volume right pleural effusion.
Right lower lobe lung nodule measures 4.3 cm, image [DATE]. Unchanged
from previous exam. Paramediastinal left upper lobe lung nodule
measures 3 cm, image [DATE]. Unchanged from previous exam.

Hepatobiliary: Large confluent mass involving the right hepatic lobe
posteriorly is again noted. This measures approximately 13.4 x
by 16.1 cm, image [DATE] and image [DATE].

Tumor extension into the IVC is identified just below the right
atrium with tumor thrombus extending into the right atrium, image
[DATE]. Tumor thrombus also extends caudally to the level of the renal
veins without signs of renal vein occlusion, image 50/27.

The middle intrahepatic portal vein is significantly attenuated by
infiltrative tumor. The right portal vein branch appears completely
occluded, image 47/20.

Dilatation of the right lobe of liver biliary radicles identified
compatible with central intrahepatic bile duct obstruction. There is
no dilatation of the biliary radicles within the left hepatic lobe.
Partially decompressed gallbladder with mild gallbladder wall edema.
No common bile duct dilatation.

Pancreas: No mass, inflammatory changes, or other parenchymal
abnormality identified.

Spleen: The spleen is enlarged measuring 14.5 cm in length likely
reflecting underlying portal venous hypertension.

Adrenals/Urinary Tract: Normal adrenal glands. No kidney mass or
hydronephrosis identified.

Stomach/Bowel: Stomach appears nondistended. No pathologic
dilatation of the visualized large or small bowel loops.

Vascular/Lymphatic: Normal caliber of the abdominal aorta. The
extrahepatic portal vein, portal venous confluence and splenic vein
remain patent.

No upper abdominal adenopathy.

Other: Small volume of perihepatic ascites is again noted. No
discrete fluid collections.

Musculoskeletal: No suspicious bone lesions identified.
IMPRESSION: 1. Large confluent mass involving the right hepatic lobe posteriorly
is again noted compatible with metastatic disease. There is tumor
extension into the IVC just below the right atrium with tumor
thrombus extending into the right atrium. IVC thrombus also extends
caudally to the level of the renal vein inflow without signs of
renal vein occlusion.
2. Complete occlusion of the right portal vein with significant
luminal attenuation of the middle portal vein by infiltrative tumor.
3. Dilatation of intrahepatic biliary radicals within the right
hepatic lobe reflecting central intrahepatic biliary obstruction. No
dilatation of the left hepatic lobe biliary radicles.
4. Moderate volume right pleural effusion.
5. Similar appearance of right pleural effusion
